# Patient Record
Sex: Female | Born: 1970 | Race: Black or African American | Hispanic: No | Marital: Single | Smoking: Never smoker
Health system: Southern US, Community
[De-identification: ages and names within clinical notes are randomized; demographics above are authoritative.]

## PROBLEM LIST (undated history)

## (undated) ENCOUNTER — Emergency Department (HOSPITAL_BASED_OUTPATIENT_CLINIC_OR_DEPARTMENT_OTHER): Admission: EM | Payer: Medicare HMO | Source: Home / Self Care

## (undated) DIAGNOSIS — Z9289 Personal history of other medical treatment: Secondary | ICD-10-CM

## (undated) DIAGNOSIS — D649 Anemia, unspecified: Secondary | ICD-10-CM

## (undated) DIAGNOSIS — R011 Cardiac murmur, unspecified: Secondary | ICD-10-CM

## (undated) DIAGNOSIS — F329 Major depressive disorder, single episode, unspecified: Secondary | ICD-10-CM

## (undated) DIAGNOSIS — J189 Pneumonia, unspecified organism: Secondary | ICD-10-CM

## (undated) DIAGNOSIS — M1990 Unspecified osteoarthritis, unspecified site: Secondary | ICD-10-CM

## (undated) DIAGNOSIS — F419 Anxiety disorder, unspecified: Secondary | ICD-10-CM

## (undated) DIAGNOSIS — F32A Depression, unspecified: Secondary | ICD-10-CM

## (undated) DIAGNOSIS — M797 Fibromyalgia: Secondary | ICD-10-CM

## (undated) DIAGNOSIS — I10 Essential (primary) hypertension: Secondary | ICD-10-CM

## (undated) DIAGNOSIS — J069 Acute upper respiratory infection, unspecified: Secondary | ICD-10-CM

## (undated) HISTORY — PX: ABDOMINAL HYSTERECTOMY: SHX81

## (undated) HISTORY — PX: LAPAROSCOPIC GASTRIC SLEEVE RESECTION: SHX5895

---

## 2002-09-24 DIAGNOSIS — D649 Anemia, unspecified: Secondary | ICD-10-CM

## 2002-09-24 DIAGNOSIS — J189 Pneumonia, unspecified organism: Secondary | ICD-10-CM

## 2002-09-24 HISTORY — PX: DILATION AND CURETTAGE OF UTERUS: SHX78

## 2002-09-24 HISTORY — DX: Anemia, unspecified: D64.9

## 2002-09-24 HISTORY — DX: Pneumonia, unspecified organism: J18.9

## 2002-09-24 HISTORY — PX: CHOLECYSTECTOMY: SHX55

## 2010-03-03 ENCOUNTER — Emergency Department (HOSPITAL_BASED_OUTPATIENT_CLINIC_OR_DEPARTMENT_OTHER): Admission: EM | Admit: 2010-03-03 | Discharge: 2010-03-03 | Payer: Self-pay | Admitting: Emergency Medicine

## 2010-03-03 ENCOUNTER — Ambulatory Visit: Payer: Self-pay | Admitting: Diagnostic Radiology

## 2010-03-27 ENCOUNTER — Emergency Department (HOSPITAL_BASED_OUTPATIENT_CLINIC_OR_DEPARTMENT_OTHER): Admission: EM | Admit: 2010-03-27 | Discharge: 2010-03-27 | Payer: Self-pay | Admitting: Emergency Medicine

## 2010-10-03 ENCOUNTER — Encounter
Admission: RE | Admit: 2010-10-03 | Discharge: 2010-10-03 | Payer: Self-pay | Source: Home / Self Care | Attending: Orthopedic Surgery | Admitting: Orthopedic Surgery

## 2010-12-11 LAB — COMPREHENSIVE METABOLIC PANEL
Alkaline Phosphatase: 108 U/L (ref 39–117)
BUN: 13 mg/dL (ref 6–23)
CO2: 28 mEq/L (ref 19–32)
Calcium: 8.9 mg/dL (ref 8.4–10.5)
Creatinine, Ser: 0.7 mg/dL (ref 0.4–1.2)
GFR calc Af Amer: >60 mL/min (ref >60)
Glucose, Bld: 113 mg/dL — ABNORMAL HIGH (ref 70–99)

## 2010-12-11 LAB — DIFFERENTIAL
Basophils Absolute: 0.3 10*3/uL — ABNORMAL HIGH (ref 0.0–0.1)
Basophils Relative: 3 % — ABNORMAL HIGH (ref 0–1)
Eosinophils Absolute: 0.5 10*3/uL (ref 0.0–0.7)
Eosinophils Relative: 6 % — ABNORMAL HIGH (ref 0–5)
Lymphocytes Relative: 29 % (ref 12–46)

## 2010-12-11 LAB — URINALYSIS, ROUTINE W REFLEX MICROSCOPIC
Bilirubin Urine: NEGATIVE
Glucose, UA: NEGATIVE mg/dL
Hgb urine dipstick: NEGATIVE
Ketones, ur: NEGATIVE mg/dL
Nitrite: NEGATIVE
Protein, ur: NEGATIVE mg/dL
Specific Gravity, Urine: 1.025 (ref 1.005–1.030)

## 2010-12-11 LAB — LIPASE, BLOOD: Lipase: 62 U/L (ref 23–300)

## 2010-12-11 LAB — URINE CULTURE

## 2010-12-11 LAB — STREP A DNA PROBE

## 2010-12-11 LAB — CBC
Hemoglobin: 10.9 g/dL — ABNORMAL LOW (ref 12.0–15.0)
Platelets: 315 10*3/uL (ref 150–400)
RBC: 3.75 MIL/uL — ABNORMAL LOW (ref 3.87–5.11)

## 2010-12-11 LAB — PREGNANCY, URINE: Preg Test, Ur: NEGATIVE

## 2010-12-12 ENCOUNTER — Emergency Department (INDEPENDENT_AMBULATORY_CARE_PROVIDER_SITE_OTHER): Payer: Medicaid Other

## 2010-12-12 ENCOUNTER — Emergency Department (HOSPITAL_BASED_OUTPATIENT_CLINIC_OR_DEPARTMENT_OTHER)
Admission: EM | Admit: 2010-12-12 | Discharge: 2010-12-13 | Disposition: A | Discharge status: Home / Self Care | Payer: Medicaid Other | Attending: Emergency Medicine | Admitting: Emergency Medicine

## 2010-12-12 DIAGNOSIS — E669 Obesity, unspecified: Secondary | ICD-10-CM | POA: Insufficient documentation

## 2010-12-12 DIAGNOSIS — I10 Essential (primary) hypertension: Secondary | ICD-10-CM

## 2010-12-12 DIAGNOSIS — G8929 Other chronic pain: Secondary | ICD-10-CM | POA: Insufficient documentation

## 2010-12-12 DIAGNOSIS — R109 Unspecified abdominal pain: Secondary | ICD-10-CM | POA: Insufficient documentation

## 2010-12-12 DIAGNOSIS — E7800 Pure hypercholesterolemia, unspecified: Secondary | ICD-10-CM | POA: Insufficient documentation

## 2010-12-12 DIAGNOSIS — N390 Urinary tract infection, site not specified: Secondary | ICD-10-CM | POA: Insufficient documentation

## 2010-12-12 DIAGNOSIS — K5900 Constipation, unspecified: Secondary | ICD-10-CM | POA: Insufficient documentation

## 2010-12-12 LAB — COMPREHENSIVE METABOLIC PANEL
BUN: 10 mg/dL (ref 6–23)
CO2: 24 mEq/L (ref 19–32)
Calcium: 9.1 mg/dL (ref 8.4–10.5)
Creatinine, Ser: 0.7 mg/dL (ref 0.4–1.2)
GFR calc non Af Amer: >60 mL/min (ref >60)
Potassium: 4.2 mEq/L (ref 3.5–5.1)
Sodium: 142 mEq/L (ref 135–145)
Total Bilirubin: 0.4 mg/dL (ref 0.3–1.2)

## 2010-12-12 LAB — URINALYSIS, ROUTINE W REFLEX MICROSCOPIC
Bilirubin Urine: NEGATIVE
Glucose, UA: NEGATIVE mg/dL
Ketones, ur: NEGATIVE mg/dL
Urobilinogen, UA: 0.2 mg/dL (ref 0.0–1.0)

## 2010-12-12 LAB — DIFFERENTIAL
Basophils Relative: 1 % (ref 0–1)
Eosinophils Absolute: 0.5 10*3/uL (ref 0.0–0.7)
Lymphocytes Relative: 37 % (ref 12–46)
Lymphs Abs: 2.8 10*3/uL (ref 0.7–4.0)
Monocytes Absolute: 0.9 10*3/uL (ref 0.1–1.0)
Monocytes Relative: 12 % (ref 3–12)
Neutrophils Relative %: 43 % (ref 43–77)

## 2010-12-12 LAB — WET PREP, GENITAL: Yeast Wet Prep HPF POC: NONE SEEN

## 2010-12-12 LAB — URINE MICROSCOPIC-ADD ON

## 2010-12-12 LAB — CBC
HCT: 33.5 % — ABNORMAL LOW (ref 36.0–46.0)
MCH: 28.1 pg (ref 26.0–34.0)
MCV: 86.3 fL (ref 78.0–100.0)
Platelets: 375 10*3/uL (ref 150–400)
RBC: 3.88 MIL/uL (ref 3.87–5.11)
RDW: 14.5 % (ref 11.5–15.5)
WBC: 7.5 10*3/uL (ref 4.0–10.5)

## 2011-03-28 ENCOUNTER — Emergency Department (INDEPENDENT_AMBULATORY_CARE_PROVIDER_SITE_OTHER): Payer: Medicaid Other

## 2011-03-28 ENCOUNTER — Emergency Department (HOSPITAL_BASED_OUTPATIENT_CLINIC_OR_DEPARTMENT_OTHER)
Admission: EM | Admit: 2011-03-28 | Discharge: 2011-03-28 | Disposition: A | Discharge status: Home / Self Care | Payer: Medicaid Other | Attending: Emergency Medicine | Admitting: Emergency Medicine

## 2011-03-28 DIAGNOSIS — R05 Cough: Secondary | ICD-10-CM

## 2011-03-28 DIAGNOSIS — G8929 Other chronic pain: Secondary | ICD-10-CM | POA: Insufficient documentation

## 2011-03-28 DIAGNOSIS — R1013 Epigastric pain: Secondary | ICD-10-CM

## 2011-03-28 DIAGNOSIS — E669 Obesity, unspecified: Secondary | ICD-10-CM | POA: Insufficient documentation

## 2011-03-28 DIAGNOSIS — E7800 Pure hypercholesterolemia, unspecified: Secondary | ICD-10-CM | POA: Insufficient documentation

## 2011-03-28 LAB — URINALYSIS, ROUTINE W REFLEX MICROSCOPIC
Leukocytes, UA: NEGATIVE
Protein, ur: NEGATIVE mg/dL
Urobilinogen, UA: 0.2 mg/dL (ref 0.0–1.0)

## 2011-03-28 LAB — PREGNANCY, URINE: Preg Test, Ur: NEGATIVE

## 2011-03-28 LAB — CBC
HCT: 31 % — ABNORMAL LOW (ref 36.0–46.0)
Hemoglobin: 10.2 g/dL — ABNORMAL LOW (ref 12.0–15.0)
MCH: 27.3 pg (ref 26.0–34.0)
MCHC: 32.9 g/dL (ref 30.0–36.0)

## 2011-03-28 LAB — COMPREHENSIVE METABOLIC PANEL
BUN: 12 mg/dL (ref 6–23)
Calcium: 9.8 mg/dL (ref 8.4–10.5)
GFR calc Af Amer: >60 mL/min (ref >60)
Glucose, Bld: 111 mg/dL — ABNORMAL HIGH (ref 70–99)
Sodium: 136 mEq/L (ref 135–145)
Total Protein: 7.2 g/dL (ref 6.0–8.3)

## 2011-03-28 LAB — DIFFERENTIAL
Basophils Relative: 0 % (ref 0–1)
Lymphocytes Relative: 25 % (ref 12–46)
Monocytes Absolute: 0.9 10*3/uL (ref 0.1–1.0)
Neutro Abs: 5.3 10*3/uL (ref 1.7–7.7)

## 2011-03-28 LAB — LIPASE, BLOOD: Lipase: 17 U/L (ref 11–59)

## 2012-01-15 ENCOUNTER — Other Ambulatory Visit: Payer: Self-pay | Admitting: Orthopedic Surgery

## 2012-01-15 DIAGNOSIS — M25552 Pain in left hip: Secondary | ICD-10-CM

## 2012-01-19 ENCOUNTER — Inpatient Hospital Stay: Admission: RE | Admit: 2012-01-19 | Payer: Medicaid Other | Source: Ambulatory Visit

## 2012-01-24 ENCOUNTER — Ambulatory Visit
Admission: RE | Admit: 2012-01-24 | Discharge: 2012-01-24 | Disposition: A | Discharge status: Home / Self Care | Payer: Medicaid Other | Source: Ambulatory Visit | Attending: Orthopedic Surgery | Admitting: Orthopedic Surgery

## 2012-01-24 DIAGNOSIS — M25552 Pain in left hip: Secondary | ICD-10-CM

## 2012-02-15 ENCOUNTER — Encounter (HOSPITAL_COMMUNITY): Payer: Self-pay

## 2012-02-15 ENCOUNTER — Ambulatory Visit (HOSPITAL_COMMUNITY)
Admission: RE | Admit: 2012-02-15 | Discharge: 2012-02-15 | Disposition: A | Discharge status: Home / Self Care | Payer: Medicaid Other | Source: Ambulatory Visit | Attending: Gastroenterology | Admitting: Gastroenterology

## 2012-02-15 ENCOUNTER — Encounter (HOSPITAL_COMMUNITY)
Admission: RE | Disposition: A | Discharge status: Home / Self Care | Payer: Self-pay | Source: Ambulatory Visit | Attending: Gastroenterology

## 2012-02-15 DIAGNOSIS — K644 Residual hemorrhoidal skin tags: Secondary | ICD-10-CM | POA: Insufficient documentation

## 2012-02-15 DIAGNOSIS — R109 Unspecified abdominal pain: Secondary | ICD-10-CM | POA: Insufficient documentation

## 2012-02-15 DIAGNOSIS — K5730 Diverticulosis of large intestine without perforation or abscess without bleeding: Secondary | ICD-10-CM | POA: Insufficient documentation

## 2012-02-15 DIAGNOSIS — I10 Essential (primary) hypertension: Secondary | ICD-10-CM | POA: Insufficient documentation

## 2012-02-15 DIAGNOSIS — R195 Other fecal abnormalities: Secondary | ICD-10-CM | POA: Insufficient documentation

## 2012-02-15 DIAGNOSIS — K648 Other hemorrhoids: Secondary | ICD-10-CM | POA: Insufficient documentation

## 2012-02-15 DIAGNOSIS — IMO0001 Reserved for inherently not codable concepts without codable children: Secondary | ICD-10-CM | POA: Insufficient documentation

## 2012-02-15 HISTORY — DX: Fibromyalgia: M79.7

## 2012-02-15 HISTORY — DX: Unspecified osteoarthritis, unspecified site: M19.90

## 2012-02-15 HISTORY — DX: Anemia, unspecified: D64.9

## 2012-02-15 HISTORY — PX: COLONOSCOPY: SHX5424

## 2012-02-15 HISTORY — DX: Essential (primary) hypertension: I10

## 2012-02-15 HISTORY — PX: ESOPHAGOGASTRODUODENOSCOPY: SHX5428

## 2012-02-15 SURGERY — EGD (ESOPHAGOGASTRODUODENOSCOPY)
Anesthesia: Moderate Sedation

## 2012-02-15 MED ORDER — MIDAZOLAM HCL 10 MG/2ML IJ SOLN
INTRAMUSCULAR | Status: DC | PRN
  Administered 2012-02-15: 2 mg via INTRAVENOUS
  Administered 2012-02-15 (×2): 1 mg via INTRAVENOUS
  Administered 2012-02-15: 2 mg via INTRAVENOUS
  Administered 2012-02-15: 1 mg via INTRAVENOUS

## 2012-02-15 MED ORDER — DIPHENHYDRAMINE HCL 50 MG/ML IJ SOLN
INTRAMUSCULAR | Status: AC
  Filled 2012-02-15: qty 1

## 2012-02-15 MED ORDER — MIDAZOLAM HCL 10 MG/2ML IJ SOLN
INTRAMUSCULAR | Status: AC
  Filled 2012-02-15: qty 4

## 2012-02-15 MED ORDER — FENTANYL NICU IV SYRINGE 50 MCG/ML
INJECTION | INTRAMUSCULAR | Status: DC | PRN
  Administered 2012-02-15 (×4): 25 ug via INTRAVENOUS

## 2012-02-15 MED ORDER — BUTAMBEN-TETRACAINE-BENZOCAINE 2-2-14 % EX AERO
INHALATION_SPRAY | CUTANEOUS | Status: DC | PRN
  Administered 2012-02-15: 2 via TOPICAL

## 2012-02-15 MED ORDER — SODIUM CHLORIDE 0.9 % IV SOLN
Freq: Once | INTRAVENOUS | Status: AC
  Administered 2012-02-15: 14:00:00 via INTRAVENOUS

## 2012-02-15 MED ORDER — FENTANYL CITRATE 0.05 MG/ML IJ SOLN
INTRAMUSCULAR | Status: AC
  Filled 2012-02-15: qty 4

## 2012-02-15 NOTE — Op Note (Signed)
[facility] [address] [place], [place]  [address]  OPERATIVE PROCEDURE REPORT  PATIENT:  Megan Robles, Megan Robles  MR#:  [id number] BIRTHDATE:  07/18/1971  GENDER:  female ENDOSCOPIST:  Jeani Hawking, MD ASSISTANT:  Beryle Beams, Technician and Janae Sauce, RN PROCEDURE DATE:  02/15/2012 PROCEDURE:  Colonoscopy with snare polypectomy ASA CLASS:  Class III INDICATIONS:  ABM pain and Heme positive stool MEDICATIONS:  Fentanyl 25 mcg IV, Versed 1 mg IV  DESCRIPTION OF PROCEDURE:   After the risks benefits and alternatives of the procedure were thoroughly explained, informed consent was obtained.  Digital rectal exam was performed and revealed no abnormalities.   The Pentax Colonoscope [id number] endoscope was introduced through the anus and advanced to the terminal ileum which was intubated for a short distance, without limitations.  The quality of the prep was excellent..  The instrument was then slowly withdrawn as the colon was fully examined. <<PROCEDUREIMAGES>>  FINDINGS:  A 3 mm sessile rectosigmoid colon polyp was removed with a cold snare. Scattered diverticula were noted throughout the entire colon, but the highest concentration was in the sigmoid colon. No other abnormalities identified.   Retroflexed views in the rectum revealed internal and external hemorrhoids.    The scope was then withdrawn from the patient and the procedure terminated.  COMPLICATIONS:  None  IMPRESSION:  1) Sessile polyp 2) Internal and external hemorrhoids 3) Diverticula RECOMMENDATIONS:  1) Await biopsy results. 2) Repeat colonoscopy in 5-10 years. 3) Trial of hyoscyamine. 4) RTC in 1 month.  ______________________________ Jeani Hawking, MD  n. Rosalie DoctorJeani Hawking at 02/15/2012 03:42 PM  Diallo, Delice Bison, [phone]

## 2012-02-15 NOTE — Op Note (Signed)
[facility] [address] [place], [place]  [address]  OPERATIVE PROCEDURE REPORT  PATIENT:  Megan, Robles  MR#:  [id number] BIRTHDATE:  11-Jan-1971  GENDER:  female ENDOSCOPIST:  Jeani Hawking, MD ASSISTANT:  Beryle Beams, Technician and Janae Sauce, RN PROCEDURE DATE:  02/15/2012 PROCEDURE:  EGD, diagnostic [phone] ASA CLASS:  Class III INDICATIONS:  ABM pain and heme positive stool MEDICATIONS:  Fentanyl 75 mcg IV, Versed 6 mg IV  DESCRIPTION OF PROCEDURE:   After the risks benefits and alternatives of the procedure were thoroughly explained, informed consent was obtained.  The Pentax Gastroscope [id number] endoscope was introduced through the mouth and advanced to the second portion of the duodenum, without limitations.  The instrument was slowly withdrawn as the mucosa was fully examined. <<PROCEDUREIMAGES>>  FINDINGS: The upper, middle, and distal third of the esophagus were carefully inspected and no abnormalities were noted. The z-line was well seen at the GEJ. The endoscope was pushed into the fundus which was normal including a retroflexed view. The antrum,gastric body, first and second part of the duodenum were unremarkable.    Retroflexed views revealed no abnormalities. The scope was then withdrawn from the patient and the procedure terminated.  COMPLICATIONS:  None  IMPRESSION:  1) Normal EGD RECOMMENDATIONS:  1) Proceed with the colonoscopy.  ______________________________ Jeani Hawking, MD  n. Rosalie DoctorJeani Hawking at 02/15/2012 03:13 PM  Alfonse Alpers, [phone]

## 2012-02-15 NOTE — H&P (Signed)
  Reason for Consult: ABM pain and Heme positive stool Referring Physician: Joneen Roach, N.P.  Megan Robles HPI: This is a 41 year old female with complains of worsening abdominal pain over the past 7 months, however, the onset of the pain was one year ago.  The pain is described as a sharp pain in the upper abdomen.  She will also have twinges of pain in her right side of the abdomen.  The sharp pain will last for approximately 2 minutes and then it can recur in 4-5 hours.  No association with diarrhea or constipation, but she reports one discrete episode of minor hematochezia.  She does not notice any inciting factors for her pain.  Physical movement does not seem to worsen the pain.  Indomethacin was prescribed for her one year ago for arthritis in her hips and legs.  Past Medical History  Diagnosis Date  . Hypertension   . Anemia   . Arthritis   . Fibromyalgia     Past Surgical History  Procedure Date  . Cholecystectomy 2004  . Dilation and curettage of uterus 2004    History reviewed. No pertinent family history.  Social History:  reports that she has never smoked. She does not have any smokeless tobacco history on file. She reports that she does not drink alcohol or use illicit drugs.  Allergies: Not on File  Medications:  Scheduled:   . sodium chloride   Intravenous Once   Continuous:   No results found for this or any previous visit (from the past 24 hour(s)).   No results found.  ROS:  As stated above in the HPI otherwise negative.  Blood pressure 150/89, pulse 84, temperature 98.1 F (36.7 C), temperature source Oral, resp. rate 18, height 5\' 4"  (1.626 m), weight 146.058 kg (322 lb), last menstrual period 12/16/2011, SpO2 97.00%.    PE: Gen: NAD, Alert and Oriented HEENT:  [place]/AT, EOMI Neck: Supple, no LAD Lungs: CTA Bilaterally CV: RRR without M/G/R ABM: Soft, NTND, +BS Ext: No C/C/E  Assessment/Plan: 1) ABM pain. 2) Heme positive  stool.  Plan: 1) EGD/Colonoscopy.  Bob Eastwood D 02/15/2012, 2:34 PM

## 2012-02-15 NOTE — Discharge Instructions (Addendum)
Endoscopy Care After Please read the instructions outlined below and refer to this sheet in the next few weeks. These discharge instructions provide you with general information on caring for yourself after you leave the hospital. Your doctor may also give you specific instructions. While your treatment has been planned according to the most current medical practices available, unavoidable complications occasionally occur. If you have any problems or questions after discharge, please call your doctor. HOME CARE INSTRUCTIONS Activity  You may resume your regular activity but move at a slower pace for the next 24 hours.   Take frequent rest periods for the next 24 hours.   Walking will help expel (get rid of) the air and reduce the bloated feeling in your abdomen.   No driving for 24 hours (because of the anesthesia (medicine) used during the test).   You may shower.   Do not sign any important legal documents or operate any machinery for 24 hours (because of the anesthesia used during the test).  Nutrition  Drink plenty of fluids.   You may resume your normal diet.   Begin with a light meal and progress to your normal diet.   Avoid alcoholic beverages for 24 hours or as instructed by your caregiver.  Medications You may resume your normal medications unless your caregiver tells you otherwise. What you can expect today  You may experience abdominal discomfort such as a feeling of fullness or "gas" pains.   You may experience a sore throat for 2 to 3 days. This is normal. Gargling with salt water may help this.  Follow-up Your doctor will discuss the results of your test with you. SEEK IMMEDIATE MEDICAL CARE IF:  You have excessive nausea (feeling sick to your stomach) and/or vomiting.   You have severe abdominal pain and distention (swelling).   You have trouble swallowing.   You have a temperature over 100 F (37.8 C).   You have rectal bleeding or vomiting of blood.    Document Released: 04/24/2004 Document Revised: 08/30/2011 Document Reviewed: 11/05/2007 ExitCare Patient Information 2012 [employer].  Colonoscopy Care After Read the instructions outlined below and refer to this sheet in the next few weeks. These discharge instructions provide you with general information on caring for yourself after you leave the hospital. Your doctor may also give you specific instructions. While your treatment has been planned according to the most current medical practices available, unavoidable complications occasionally occur. If you have any problems or questions after discharge, call your doctor. HOME CARE INSTRUCTIONS ACTIVITY:  You may resume your regular activity, but move at a slower pace for the next 24 hours.   Take frequent rest periods for the next 24 hours.   Walking will help get rid of the air and reduce the bloated feeling in your belly (abdomen).   No driving for 24 hours (because of the medicine (anesthesia) used during the test).   You may shower.   Do not sign any important legal documents or operate any machinery for 24 hours (because of the anesthesia used during the test).  NUTRITION:  Drink plenty of fluids.   You may resume your normal diet as instructed by your doctor.   Begin with a light meal and progress to your normal diet. Heavy or fried foods are harder to digest and may make you feel sick to your stomach (nauseated).   Avoid alcoholic beverages for 24 hours or as instructed.  MEDICATIONS:  You may resume your normal medications unless your   doctor tells you otherwise.  WHAT TO EXPECT TODAY:  Some feelings of bloating in the abdomen.   Passage of more gas than usual.   Spotting of blood in your stool or on the toilet paper.  IF YOU HAD POLYPS REMOVED DURING THE COLONOSCOPY:  No aspirin products for 7 days or as instructed.   No alcohol for 7 days or as instructed.   Eat a soft diet for the next 24 hours.   FINDING OUT THE RESULTS OF YOUR TEST Not all test results are available during your visit. If your test results are not back during the visit, make an appointment with your caregiver to find out the results. Do not assume everything is normal if you have not heard from your caregiver or the medical facility. It is important for you to follow up on all of your test results.  SEEK IMMEDIATE MEDICAL CARE IF:  You have more than a spotting of blood in your stool.   Your belly is swollen (abdominal distention).   You are nauseated or vomiting.   You have a fever.   You have abdominal pain or discomfort that is severe or gets worse throughout the day.  Document Released: 04/24/2004 Document Revised: 08/30/2011 Document Reviewed: 04/22/2008 ExitCare Patient Information 2012 [employer]. 

## 2012-02-20 ENCOUNTER — Encounter (HOSPITAL_COMMUNITY): Payer: Self-pay | Admitting: Gastroenterology

## 2014-01-14 ENCOUNTER — Encounter (HOSPITAL_COMMUNITY): Payer: Self-pay | Admitting: Pharmacy Technician

## 2014-01-15 ENCOUNTER — Encounter (INDEPENDENT_AMBULATORY_CARE_PROVIDER_SITE_OTHER): Payer: Self-pay

## 2014-01-15 ENCOUNTER — Encounter (HOSPITAL_COMMUNITY): Payer: Self-pay

## 2014-01-15 ENCOUNTER — Ambulatory Visit (HOSPITAL_COMMUNITY)
Admission: RE | Admit: 2014-01-15 | Discharge: 2014-01-15 | Disposition: A | Discharge status: Home / Self Care | Payer: Medicaid Other | Source: Ambulatory Visit | Attending: Anesthesiology | Admitting: Anesthesiology

## 2014-01-15 ENCOUNTER — Encounter (HOSPITAL_COMMUNITY)
Admission: RE | Admit: 2014-01-15 | Discharge: 2014-01-15 | Disposition: A | Discharge status: Home / Self Care | Payer: Medicaid Other | Source: Ambulatory Visit | Attending: Orthopedic Surgery | Admitting: Orthopedic Surgery

## 2014-01-15 DIAGNOSIS — Z01818 Encounter for other preprocedural examination: Secondary | ICD-10-CM | POA: Insufficient documentation

## 2014-01-15 DIAGNOSIS — Z01812 Encounter for preprocedural laboratory examination: Secondary | ICD-10-CM | POA: Insufficient documentation

## 2014-01-15 DIAGNOSIS — M169 Osteoarthritis of hip, unspecified: Secondary | ICD-10-CM | POA: Insufficient documentation

## 2014-01-15 DIAGNOSIS — M1610 Unilateral primary osteoarthritis, unspecified hip: Secondary | ICD-10-CM | POA: Insufficient documentation

## 2014-01-15 HISTORY — DX: Depression, unspecified: F32.A

## 2014-01-15 HISTORY — DX: Personal history of other medical treatment: Z92.89

## 2014-01-15 HISTORY — DX: Pneumonia, unspecified organism: J18.9

## 2014-01-15 HISTORY — DX: Major depressive disorder, single episode, unspecified: F32.9

## 2014-01-15 LAB — URINALYSIS, ROUTINE W REFLEX MICROSCOPIC
BILIRUBIN URINE: NEGATIVE
Glucose, UA: NEGATIVE mg/dL
HGB URINE DIPSTICK: NEGATIVE
Ketones, ur: NEGATIVE mg/dL
Nitrite: NEGATIVE
PROTEIN: 30 mg/dL — AB
Specific Gravity, Urine: 1.025 (ref 1.005–1.030)
UROBILINOGEN UA: 0.2 mg/dL (ref 0.0–1.0)
pH: 6 (ref 5.0–8.0)

## 2014-01-15 LAB — BASIC METABOLIC PANEL WITH GFR
BUN: 14 mg/dL (ref 6–23)
CO2: 23 meq/L (ref 19–32)
Calcium: 9.1 mg/dL (ref 8.4–10.5)
Chloride: 99 meq/L (ref 96–112)
Creatinine, Ser: 0.74 mg/dL (ref 0.50–1.10)
GFR calc Af Amer: >90 mL/min
GFR calc non Af Amer: >90 mL/min
Glucose, Bld: 106 mg/dL — ABNORMAL HIGH (ref 70–99)
Potassium: 3.8 meq/L (ref 3.7–5.3)
Sodium: 135 meq/L — ABNORMAL LOW (ref 137–147)

## 2014-01-15 LAB — HCG, SERUM, QUALITATIVE: Preg, Serum: NEGATIVE

## 2014-01-15 LAB — SURGICAL PCR SCREEN
MRSA, PCR: NEGATIVE
Staphylococcus aureus: NEGATIVE

## 2014-01-15 LAB — CBC
HCT: 31.4 % — ABNORMAL LOW (ref 36.0–46.0)
Hemoglobin: 9.7 g/dL — ABNORMAL LOW (ref 12.0–15.0)
MCH: 24 pg — ABNORMAL LOW (ref 26.0–34.0)
MCHC: 30.9 g/dL (ref 30.0–36.0)
MCV: 77.7 fL — ABNORMAL LOW (ref 78.0–100.0)
Platelets: 393 10*3/uL (ref 150–400)
RBC: 4.04 MIL/uL (ref 3.87–5.11)
RDW: 15.6 % — ABNORMAL HIGH (ref 11.5–15.5)
WBC: 6.5 10*3/uL (ref 4.0–10.5)

## 2014-01-15 LAB — APTT: aPTT: 34 seconds (ref 24–37)

## 2014-01-15 LAB — PROTIME-INR
INR: 1.01 (ref 0.00–1.49)
Prothrombin Time: 13.1 seconds (ref 11.6–15.2)

## 2014-01-15 LAB — URINE MICROSCOPIC-ADD ON

## 2014-01-15 NOTE — Patient Instructions (Signed)
Your procedure is scheduled on:  01/26/14  TUESDAY  [facility] at   0600    AM.  Call this number if you have problems the morning of surgery: [phone]        Do not eat food  Or drink :After Midnight Monday NIGHT.   Take these medicines the morning of surgery with A SIP OF WATER:NO REGULAR MEDICATIONS--- MAY TAKE TRAMADOL IF NEEDED   .  Contacts, dentures or partial plates, or metal hairpins  can not be worn to surgery. Your family will be responsible for glasses, dentures, hearing aides while you are in surgery  Leave suitcase in the car. After surgery it may be brought to your room.  For patients admitted to the hospital, checkout time is 11:00 AM day of  discharge.                DO NOT WEAR JEWELRY, LOTIONS, POWDERS, OR PERFUMES.  WOMEN-- DO NOT SHAVE LEGS OR UNDERARMS FOR 48 HOURS BEFORE SHOWERS. MEN MAY SHAVE FACE.  Patients discharged the day of surgery will not be allowed to drive home. IF going home the day of surgery, you must have a driver and someone to stay with you for the first 24 hours                                                                 [facility] - Preparing for Surgery Before surgery, you can play an important role.  Because skin is not sterile, your skin needs to be as free of germs as possible.  You can reduce the number of germs on your skin by washing with CHG (chlorahexidine gluconate) soap before surgery.  CHG is an antiseptic cleaner which kills germs and bonds with the skin to continue killing germs even after washing. Please DO NOT use if you have an allergy to CHG or antibacterial soaps.  If your skin becomes reddened/irritated stop using the CHG and inform your nurse when you arrive at Short Stay. Do not shave (including legs and underarms) for at least 48 hours prior to the first CHG shower.  You may shave your face. Please follow these instructions carefully:  1.  Shower with CHG Soap the night before surgery and  the  morning of Surgery.  2.  If you choose to wash your hair, wash your hair first as usual with your  normal  shampoo.  3.  After you shampoo, rinse your hair and body thoroughly to remove the  shampoo.                           4.  Use CHG as you would any other liquid soap.  You can apply chg directly  to the skin and wash                       Gently with a scrungie or clean washcloth.  5.  Apply the CHG Soap to your body ONLY FROM THE NECK DOWN.   Do not use on open  Wound or open sores. Avoid contact with eyes, ears mouth and genitals (private parts).                        Genitals (private parts) with your normal soap.             6.  Wash thoroughly, paying special attention to the area where your surgery  will be performed.  7.  Thoroughly rinse your body with warm water from the neck down.  8.  DO NOT shower/wash with your normal soap after using and rinsing off  the CHG Soap.                9.  Pat yourself dry with a clean towel.            10.  Wear clean pajamas.            11.  Place clean sheets on your bed the night of your first shower and do not  sleep with pets. Day of Surgery : Do not apply any lotions/deodorants the morning of surgery.  Please wear clean clothes to the hospital/surgery center.  FAILURE TO FOLLOW THESE INSTRUCTIONS MAY RESULT IN THE CANCELLATION OF YOUR SURGERY PATIENT SIGNATURE_________________________________  NURSE SIGNATURE__________________________________   Incentive Spirometer  An incentive spirometer is a tool that can help keep your lungs clear and active. This tool measures how well you are filling your lungs with each breath. Taking long deep breaths may help reverse or decrease the chance of developing breathing (pulmonary) problems (especially infection) following:  A long period of time when you are unable to move or be active. BEFORE THE PROCEDURE   If the spirometer includes an indicator to show your best  effort, your nurse or respiratory therapist will set it to a desired goal.  If possible, sit up straight or lean slightly forward. Try not to slouch.  Hold the incentive spirometer in an upright position. INSTRUCTIONS FOR USE  1. Sit on the edge of your bed if possible, or sit up as far as you can in bed or on a chair. 2. Hold the incentive spirometer in an upright position. 3. Breathe out normally. 4. Place the mouthpiece in your mouth and seal your lips tightly around it. 5. Breathe in slowly and as deeply as possible, raising the piston or the ball toward the top of the column. 6. Hold your breath for 3-5 seconds or for as long as possible. Allow the piston or ball to fall to the bottom of the column. 7. Remove the mouthpiece from your mouth and breathe out normally. 8. Rest for a few seconds and repeat Steps 1 through 7 at least 10 times every 1-2 hours when you are awake. Take your time and take a few normal breaths between deep breaths. 9. The spirometer may include an indicator to show your best effort. Use the indicator as a goal to work toward during each repetition. 10. After each set of 10 deep breaths, practice coughing to be sure your lungs are clear. If you have an incision (the cut made at the time of surgery), support your incision when coughing by placing a pillow or rolled up towels firmly against it. Once you are able to get out of bed, walk around indoors and cough well. You may stop using the incentive spirometer when instructed by your caregiver.  RISKS AND COMPLICATIONS  Take your time so you do not get dizzy or light-headed.  If you are in pain, you may need to take or ask for pain medication before doing incentive spirometry. It is harder to take a deep breath if you are having pain. AFTER USE  Rest and breathe slowly and easily.  It can be helpful to keep track of a log of your progress. Your caregiver can provide you with a simple table to help with this. If you  are using the spirometer at home, follow these instructions: SEEK MEDICAL CARE IF:   You are having difficultly using the spirometer.  You have trouble using the spirometer as often as instructed.  Your pain medication is not giving enough relief while using the spirometer.  You develop fever of 100.5 F (38.1 C) or higher. SEEK IMMEDIATE MEDICAL CARE IF:   You cough up bloody sputum that had not been present before.  You develop fever of 102 F (38.9 C) or greater.  You develop worsening pain at or near the incision site. MAKE SURE YOU:   Understand these instructions.  Will watch your condition.  Will get help right away if you are not doing well or get worse. Document Released: 01/21/2007 Document Revised: 12/03/2011 Document Reviewed: 03/24/2007 [facility] Patient Information 2014 [place], [place].   WHAT IS A BLOOD TRANSFUSION? Blood Transfusion Information  A transfusion is the replacement of blood or some of its parts. Blood is made up of multiple cells which provide different functions.  Red blood cells carry oxygen and are used for blood loss replacement.  White blood cells fight against infection.  Platelets control bleeding.  Plasma helps clot blood.  Other blood products are available for specialized needs, such as hemophilia or other clotting disorders. BEFORE THE TRANSFUSION  Who gives blood for transfusions?   Healthy volunteers who are fully evaluated to make sure their blood is safe. This is blood bank blood. Transfusion therapy is the safest it has ever been in the practice of medicine. Before blood is taken from a donor, a complete history is taken to make sure that person has no history of diseases nor engages in risky social behavior (examples are intravenous drug use or sexual activity with multiple partners). The donor's travel history is screened to minimize risk of transmitting infections, such as malaria. The donated blood is tested for signs of  infectious diseases, such as HIV and hepatitis. The blood is then tested to be sure it is compatible with you in order to minimize the chance of a transfusion reaction. If you or a relative donates blood, this is often done in anticipation of surgery and is not appropriate for emergency situations. It takes many days to process the donated blood. RISKS AND COMPLICATIONS Although transfusion therapy is very safe and saves many lives, the main dangers of transfusion include:   Getting an infectious disease.  Developing a transfusion reaction. This is an allergic reaction to something in the blood you were given. Every precaution is taken to prevent this. The decision to have a blood transfusion has been considered carefully by your caregiver before blood is given. Blood is not given unless the benefits outweigh the risks. AFTER THE TRANSFUSION  Right after receiving a blood transfusion, you will usually feel much better and more energetic. This is especially true if your red blood cells have gotten low (anemic). The transfusion raises the level of the red blood cells which carry oxygen, and this usually causes an energy increase.  The nurse administering the transfusion will monitor you carefully for complications. HOME  CARE INSTRUCTIONS  No special instructions are needed after a transfusion. You may find your energy is better. Speak with your caregiver about any limitations on activity for underlying diseases you may have. SEEK MEDICAL CARE IF:   Your condition is not improving after your transfusion.  You develop redness or irritation at the intravenous (IV) site. SEEK IMMEDIATE MEDICAL CARE IF:  Any of the following symptoms occur over the next 12 hours:  Shaking chills.  You have a temperature by mouth above 102 F (38.9 C), not controlled by medicine.  Chest, back, or muscle pain.  People around you feel you are not acting correctly or are confused.  Shortness of breath or  difficulty breathing.  Dizziness and fainting.  You get a rash or develop hives.  You have a decrease in urine output.  Your urine turns a dark color or changes to pink, red, or brown. Any of the following symptoms occur over the next 10 days:  You have a temperature by mouth above 102 F (38.9 C), not controlled by medicine.  Shortness of breath.  Weakness after normal activity.  The white part of the eye turns yellow (jaundice).  You have a decrease in the amount of urine or are urinating less often.  Your urine turns a dark color or changes to pink, red, or brown. Document Released: 09/07/2000 Document Revised: 12/03/2011 Document Reviewed: 04/26/2008 [facility] Patient Information 2014 [place]

## 2014-01-15 NOTE — Progress Notes (Signed)
ekg  5/14 chart

## 2014-01-15 NOTE — Progress Notes (Signed)
Faxed cbc, bmet, u/a with micro [facility]

## 2014-01-15 NOTE — Progress Notes (Signed)
01/15/14 0913  OBSTRUCTIVE SLEEP APNEA  Have you ever been diagnosed with sleep apnea through a sleep study? No  Do you snore loudly (loud enough to be heard through closed doors)?  1  Do you often feel tired, fatigued, or sleepy during the daytime? 1  Has anyone observed you stop breathing during your sleep? 0  Do you have, or are you being treated for high blood pressure? 1  BMI more than 35 kg/m2? 1  Age over 41 years old? 0  Neck circumference greater than 40 cm/16 inches? 0  Gender: 0  Obstructive Sleep Apnea Score 4  Score 4 or greater  Results sent to PCP

## 2014-01-19 NOTE — Progress Notes (Signed)
[facility] [place] were received and cipro was  called in 01/15/14

## 2014-01-21 ENCOUNTER — Other Ambulatory Visit: Payer: Self-pay | Admitting: Physician Assistant

## 2014-01-21 NOTE — H&P (Signed)
Megan Robles is an 43 y.o. female.    Chief Complaint: left hip pain  HPI: Pt is a 43 y.o. female complaining of left hip pain for multiple years. Pain had continually increased since the beginning. X-rays in the clinic show end-stage arthritic changes of the left hip. Pt has tried various conservative treatments which have failed to alleviate their symptoms, including injections and therapy. Various options are discussed with the patient. Risks, benefits and expectations were discussed with the patient. Patient understand the risks, benefits and expectations and wishes to proceed with surgery.   PCP:  [name], GILLIAN W, NP  D/C Plans:  SNF/Rehab  PMH: Past Medical History  Diagnosis Date  . Hypertension   . Anemia   . Arthritis   . Fibromyalgia   . Depression   . History of blood transfusion   . Pneumonia     PSH: Past Surgical History  Procedure Laterality Date  . Cholecystectomy  2004  . Dilation and curettage of uterus  2004  . Esophagogastroduodenoscopy  02/15/2012    Procedure: ESOPHAGOGASTRODUODENOSCOPY (EGD);  Surgeon: Theda BelfastPatrick D Hung, MD;  [facility];  Service: Endoscopy;  Laterality: N/A;  . Colonoscopy  02/15/2012    Procedure: COLONOSCOPY;  Surgeon: Theda BelfastPatrick D Hung, MD;  [facility];  Service: Endoscopy;  Laterality: N/A;    Social History:  reports that she has never smoked. She has never used smokeless tobacco. She reports that she does not drink alcohol or use illicit drugs.  Allergies:  Allergies  Allergen Reactions  . Banana     Mouth itchy and swelling  . Kiwi Extract     Mouth itchy and swelling  . Pineapple     Mouth itchy    Medications: Current Outpatient Prescriptions  Medication Sig Dispense Refill  . diclofenac sodium (VOLTAREN) 1 % GEL Apply topically 4 (four) times daily.      . DULoxetine (CYMBALTA) 60 MG capsule Take 60 mg by mouth at bedtime.       . ferrous sulfate 325 (65 FE) MG EC tablet Take 325 mg by mouth as  needed.      . hydrochlorothiazide (MICROZIDE) 12.5 MG capsule Take 25 mg by mouth at bedtime.      . indomethacin (INDOCIN) 25 MG capsule Take 50 mg by mouth 4 (four) times daily.       . traMADol (ULTRAM) 50 MG tablet Take 100 mg by mouth 3 (three) times daily.       No current facility-administered medications for this visit.    No results found for this or any previous visit (from the past 48 hour(s)). No results found.  ROS: Pain with rom of the left lower extremity  Physical Exam: BP:   142/84  ;  HR:   80  ; Resp:   14  : Alert and oriented 43 y.o. female in no acute distress Cranial nerves 2-12 intact Cervical spine: full rom with no tenderness, nv intact distally Chest: active breath sounds bilaterally, no wheeze rhonchi or rales Heart: regular rate and rhythm, no murmur Abd: non tender non distended with active bowel sounds Hip is stable with rom  Moderate stiffness and pain with ER and IR of left hip nv intact distally No rashes or edema Minimally antalgic gait  Assessment/Plan Assessment: left Hip OA and pain   Plan: Patient will undergo a left total hip arthroplasty by Dr. Charlann Boxerlin. Risks benefits and expectations were discussed with the patient. Patient understand risks, benefits and  expectations and wishes to proceed.

## 2014-01-25 MED ORDER — DEXTROSE 5 % IV SOLN
3.0000 g | INTRAVENOUS | Status: AC
  Administered 2014-01-26: 3 g via INTRAVENOUS
  Filled 2014-01-25: qty 3000

## 2014-01-26 ENCOUNTER — Inpatient Hospital Stay (HOSPITAL_COMMUNITY): Payer: Medicaid Other

## 2014-01-26 ENCOUNTER — Encounter (HOSPITAL_COMMUNITY): Payer: Medicaid Other | Admitting: Registered Nurse

## 2014-01-26 ENCOUNTER — Encounter (HOSPITAL_COMMUNITY)
Admission: RE | Disposition: A | Discharge status: Home Health | Payer: Self-pay | Source: Ambulatory Visit | Attending: Orthopedic Surgery

## 2014-01-26 ENCOUNTER — Encounter (HOSPITAL_COMMUNITY): Payer: Self-pay | Admitting: *Deleted

## 2014-01-26 ENCOUNTER — Inpatient Hospital Stay (HOSPITAL_COMMUNITY): Payer: Medicaid Other | Admitting: Registered Nurse

## 2014-01-26 ENCOUNTER — Inpatient Hospital Stay (HOSPITAL_COMMUNITY)
Admission: RE | Admit: 2014-01-26 | Discharge: 2014-01-29 | DRG: 470 | Disposition: A | Discharge status: Home Health | Payer: Medicaid Other | Source: Ambulatory Visit | Attending: Orthopedic Surgery | Admitting: Orthopedic Surgery

## 2014-01-26 DIAGNOSIS — R42 Dizziness and giddiness: Secondary | ICD-10-CM | POA: Diagnosis not present

## 2014-01-26 DIAGNOSIS — I10 Essential (primary) hypertension: Secondary | ICD-10-CM | POA: Diagnosis present

## 2014-01-26 DIAGNOSIS — Z6841 Body Mass Index (BMI) 40.0 and over, adult: Secondary | ICD-10-CM

## 2014-01-26 DIAGNOSIS — F329 Major depressive disorder, single episode, unspecified: Secondary | ICD-10-CM | POA: Diagnosis present

## 2014-01-26 DIAGNOSIS — Z96649 Presence of unspecified artificial hip joint: Secondary | ICD-10-CM

## 2014-01-26 DIAGNOSIS — M169 Osteoarthritis of hip, unspecified: Principal | ICD-10-CM | POA: Diagnosis present

## 2014-01-26 DIAGNOSIS — IMO0001 Reserved for inherently not codable concepts without codable children: Secondary | ICD-10-CM | POA: Diagnosis present

## 2014-01-26 DIAGNOSIS — M1610 Unilateral primary osteoarthritis, unspecified hip: Principal | ICD-10-CM | POA: Diagnosis present

## 2014-01-26 DIAGNOSIS — D500 Iron deficiency anemia secondary to blood loss (chronic): Secondary | ICD-10-CM | POA: Diagnosis not present

## 2014-01-26 DIAGNOSIS — F3289 Other specified depressive episodes: Secondary | ICD-10-CM | POA: Diagnosis present

## 2014-01-26 DIAGNOSIS — D62 Acute posthemorrhagic anemia: Secondary | ICD-10-CM | POA: Diagnosis not present

## 2014-01-26 DIAGNOSIS — Z79899 Other long term (current) drug therapy: Secondary | ICD-10-CM

## 2014-01-26 HISTORY — PX: TOTAL HIP ARTHROPLASTY: SHX124

## 2014-01-26 LAB — PREPARE RBC (CROSSMATCH)

## 2014-01-26 LAB — ABO/RH: ABO/RH(D): O POS

## 2014-01-26 SURGERY — ARTHROPLASTY, HIP, TOTAL, ANTERIOR APPROACH
Anesthesia: General | Site: Hip | Laterality: Left

## 2014-01-26 MED ORDER — SENNA 8.6 MG PO TABS
1.0000 | ORAL_TABLET | Freq: Two times a day (BID) | ORAL | Status: DC
  Administered 2014-01-26 – 2014-01-29 (×6): 8.6 mg via ORAL

## 2014-01-26 MED ORDER — DEXAMETHASONE SODIUM PHOSPHATE 10 MG/ML IJ SOLN
INTRAMUSCULAR | Status: AC
  Filled 2014-01-26: qty 1

## 2014-01-26 MED ORDER — MIDAZOLAM HCL 2 MG/2ML IJ SOLN
INTRAMUSCULAR | Status: AC
  Filled 2014-01-26: qty 2

## 2014-01-26 MED ORDER — LABETALOL HCL 5 MG/ML IV SOLN
INTRAVENOUS | Status: DC | PRN
  Administered 2014-01-26: 5 mg via INTRAVENOUS
  Administered 2014-01-26 (×2): 2.5 mg via INTRAVENOUS
  Administered 2014-01-26: 5 mg via INTRAVENOUS
  Administered 2014-01-26: 2.5 mg via INTRAVENOUS

## 2014-01-26 MED ORDER — HYDROMORPHONE HCL PF 1 MG/ML IJ SOLN
INTRAMUSCULAR | Status: AC
Start: 2014-01-26 — End: 2014-01-26
  Filled 2014-01-26: qty 1

## 2014-01-26 MED ORDER — GLYCOPYRROLATE 0.2 MG/ML IJ SOLN
INTRAMUSCULAR | Status: DC | PRN
  Administered 2014-01-26: .8 mg via INTRAVENOUS

## 2014-01-26 MED ORDER — 0.9 % SODIUM CHLORIDE (POUR BTL) OPTIME
TOPICAL | Status: DC | PRN
  Administered 2014-01-26: 1000 mL

## 2014-01-26 MED ORDER — HYDROCHLOROTHIAZIDE 12.5 MG PO CAPS
25.0000 mg | ORAL_CAPSULE | Freq: Every day | ORAL | Status: DC
  Administered 2014-01-26 – 2014-01-29 (×3): 25 mg via ORAL
  Filled 2014-01-26 (×4): qty 2

## 2014-01-26 MED ORDER — METHOCARBAMOL 500 MG PO TABS
500.0000 mg | ORAL_TABLET | Freq: Four times a day (QID) | ORAL | Status: DC | PRN
Start: 2014-01-26 — End: 2014-01-29
  Administered 2014-01-26 – 2014-01-29 (×5): 500 mg via ORAL
  Filled 2014-01-26 (×5): qty 1

## 2014-01-26 MED ORDER — LACTATED RINGERS IV SOLN
INTRAVENOUS | Status: DC | PRN
  Administered 2014-01-26: 08:00:00 via INTRAVENOUS

## 2014-01-26 MED ORDER — NEOSTIGMINE METHYLSULFATE 10 MG/10ML IV SOLN
INTRAVENOUS | Status: DC | PRN
  Administered 2014-01-26: 5 mg via INTRAVENOUS

## 2014-01-26 MED ORDER — POLYETHYLENE GLYCOL 3350 17 G PO PACK: 17.0000 g | PACK | Freq: Every day | ORAL | Status: DC | PRN

## 2014-01-26 MED ORDER — STERILE WATER FOR IRRIGATION IR SOLN
Status: DC | PRN
  Administered 2014-01-26: 1500 mL

## 2014-01-26 MED ORDER — GLYCOPYRROLATE 0.2 MG/ML IJ SOLN
INTRAMUSCULAR | Status: AC
  Filled 2014-01-26: qty 4

## 2014-01-26 MED ORDER — ONDANSETRON HCL 4 MG/2ML IJ SOLN: 4.0000 mg | Freq: Four times a day (QID) | INTRAMUSCULAR | Status: DC | PRN

## 2014-01-26 MED ORDER — MENTHOL 3 MG MT LOZG
1.0000 | LOZENGE | OROMUCOSAL | Status: DC | PRN
  Administered 2014-01-26: 3 mg via ORAL
  Filled 2014-01-26: qty 9

## 2014-01-26 MED ORDER — CEFAZOLIN SODIUM-DEXTROSE 2-3 GM-% IV SOLR
2.0000 g | Freq: Four times a day (QID) | INTRAVENOUS | Status: AC
  Administered 2014-01-26 – 2014-01-27 (×2): 2 g via INTRAVENOUS
  Filled 2014-01-26 (×2): qty 50

## 2014-01-26 MED ORDER — DOCUSATE SODIUM 100 MG PO CAPS
100.0000 mg | ORAL_CAPSULE | Freq: Two times a day (BID) | ORAL | Status: DC
  Administered 2014-01-26 – 2014-01-29 (×6): 100 mg via ORAL

## 2014-01-26 MED ORDER — FERROUS SULFATE 325 (65 FE) MG PO TABS
325.0000 mg | ORAL_TABLET | Freq: Three times a day (TID) | ORAL | Status: DC
  Administered 2014-01-26 – 2014-01-29 (×9): 325 mg via ORAL
  Filled 2014-01-26 (×11): qty 1

## 2014-01-26 MED ORDER — HYDROMORPHONE HCL PF 2 MG/ML IJ SOLN
INTRAMUSCULAR | Status: AC
Start: 2014-01-26 — End: 2014-01-26
  Filled 2014-01-26: qty 1

## 2014-01-26 MED ORDER — ONDANSETRON HCL 4 MG/2ML IJ SOLN
INTRAMUSCULAR | Status: AC
  Filled 2014-01-26: qty 2

## 2014-01-26 MED ORDER — PROPOFOL 10 MG/ML IV BOLUS
INTRAVENOUS | Status: DC | PRN
  Administered 2014-01-26: 250 mg via INTRAVENOUS

## 2014-01-26 MED ORDER — SUCCINYLCHOLINE CHLORIDE 20 MG/ML IJ SOLN
INTRAMUSCULAR | Status: DC | PRN
  Administered 2014-01-26: 120 mg via INTRAVENOUS

## 2014-01-26 MED ORDER — LABETALOL HCL 5 MG/ML IV SOLN
INTRAVENOUS | Status: AC
  Filled 2014-01-26: qty 4

## 2014-01-26 MED ORDER — MIDAZOLAM HCL 5 MG/5ML IJ SOLN
INTRAMUSCULAR | Status: DC | PRN
  Administered 2014-01-26: 2 mg via INTRAVENOUS

## 2014-01-26 MED ORDER — LIDOCAINE HCL (CARDIAC) 20 MG/ML IV SOLN
INTRAVENOUS | Status: DC | PRN
  Administered 2014-01-26: 100 mg via INTRAVENOUS

## 2014-01-26 MED ORDER — DULOXETINE HCL 60 MG PO CPEP
60.0000 mg | ORAL_CAPSULE | Freq: Every day | ORAL | Status: DC
  Administered 2014-01-26 – 2014-01-29 (×3): 60 mg via ORAL
  Filled 2014-01-26 (×4): qty 1

## 2014-01-26 MED ORDER — HYDROMORPHONE HCL PF 1 MG/ML IJ SOLN
0.2500 mg | INTRAMUSCULAR | Status: DC | PRN
  Administered 2014-01-26 (×4): 0.5 mg via INTRAVENOUS

## 2014-01-26 MED ORDER — LIDOCAINE HCL (CARDIAC) 20 MG/ML IV SOLN
INTRAVENOUS | Status: AC
  Filled 2014-01-26: qty 5

## 2014-01-26 MED ORDER — PHENOL 1.4 % MT LIQD: 1.0000 | OROMUCOSAL | Status: DC | PRN

## 2014-01-26 MED ORDER — ONDANSETRON HCL 4 MG/2ML IJ SOLN
INTRAMUSCULAR | Status: DC | PRN
  Administered 2014-01-26: 4 mg via INTRAVENOUS

## 2014-01-26 MED ORDER — FENTANYL CITRATE 0.05 MG/ML IJ SOLN
INTRAMUSCULAR | Status: DC | PRN
  Administered 2014-01-26 (×2): 50 ug via INTRAVENOUS
  Administered 2014-01-26: 100 ug via INTRAVENOUS
  Administered 2014-01-26: 50 ug via INTRAVENOUS

## 2014-01-26 MED ORDER — HYDROMORPHONE HCL PF 1 MG/ML IJ SOLN
INTRAMUSCULAR | Status: DC | PRN
  Administered 2014-01-26 (×2): 0.5 mg via INTRAVENOUS
  Administered 2014-01-26: 1 mg via INTRAVENOUS

## 2014-01-26 MED ORDER — SODIUM CHLORIDE 0.9 % IJ SOLN
INTRAMUSCULAR | Status: AC
  Filled 2014-01-26: qty 10

## 2014-01-26 MED ORDER — HYDROCODONE-ACETAMINOPHEN 7.5-325 MG PO TABS
1.0000 | ORAL_TABLET | ORAL | Status: DC
  Administered 2014-01-26 – 2014-01-27 (×4): 2 via ORAL
  Administered 2014-01-27: 1 via ORAL
  Administered 2014-01-27 – 2014-01-29 (×10): 2 via ORAL
  Filled 2014-01-26 (×15): qty 2

## 2014-01-26 MED ORDER — ONDANSETRON HCL 4 MG PO TABS
4.0000 mg | ORAL_TABLET | Freq: Four times a day (QID) | ORAL | Status: DC | PRN
  Administered 2014-01-28: 4 mg via ORAL
  Filled 2014-01-26: qty 1

## 2014-01-26 MED ORDER — DIPHENHYDRAMINE HCL 12.5 MG/5ML PO ELIX
25.0000 mg | ORAL_SOLUTION | Freq: Four times a day (QID) | ORAL | Status: DC | PRN
  Administered 2014-01-26 (×2): 12.5 mg via ORAL
  Filled 2014-01-26 (×2): qty 10

## 2014-01-26 MED ORDER — PROPOFOL 10 MG/ML IV BOLUS
INTRAVENOUS | Status: AC
  Filled 2014-01-26: qty 20

## 2014-01-26 MED ORDER — DEXAMETHASONE SODIUM PHOSPHATE 10 MG/ML IJ SOLN
INTRAMUSCULAR | Status: DC | PRN
  Administered 2014-01-26: 10 mg via INTRAVENOUS

## 2014-01-26 MED ORDER — ROCURONIUM BROMIDE 100 MG/10ML IV SOLN
INTRAVENOUS | Status: DC | PRN
  Administered 2014-01-26 (×2): 10 mg via INTRAVENOUS
  Administered 2014-01-26: 40 mg via INTRAVENOUS

## 2014-01-26 MED ORDER — HYDROMORPHONE HCL PF 1 MG/ML IJ SOLN
0.5000 mg | INTRAMUSCULAR | Status: DC | PRN
  Administered 2014-01-26: 1 mg via INTRAVENOUS
  Filled 2014-01-26: qty 1

## 2014-01-26 MED ORDER — NEOSTIGMINE METHYLSULFATE 10 MG/10ML IV SOLN
INTRAVENOUS | Status: AC
Start: 2014-01-26 — End: 2014-01-26
  Filled 2014-01-26: qty 1

## 2014-01-26 MED ORDER — DEXAMETHASONE SODIUM PHOSPHATE 10 MG/ML IJ SOLN
10.0000 mg | Freq: Once | INTRAMUSCULAR | Status: DC
  Filled 2014-01-26: qty 1

## 2014-01-26 MED ORDER — FENTANYL CITRATE 0.05 MG/ML IJ SOLN
INTRAMUSCULAR | Status: AC
  Filled 2014-01-26: qty 5

## 2014-01-26 MED ORDER — ROCURONIUM BROMIDE 100 MG/10ML IV SOLN
INTRAVENOUS | Status: AC
  Filled 2014-01-26: qty 1

## 2014-01-26 MED ORDER — ASPIRIN EC 325 MG PO TBEC
325.0000 mg | DELAYED_RELEASE_TABLET | Freq: Two times a day (BID) | ORAL | Status: DC
Start: 2014-01-26 — End: 2014-01-29
  Administered 2014-01-26 – 2014-01-29 (×6): 325 mg via ORAL
  Filled 2014-01-26 (×8): qty 1

## 2014-01-26 MED ORDER — SODIUM CHLORIDE 0.9 % IV SOLN
INTRAVENOUS | Status: DC
  Administered 2014-01-26: 14:00:00 via INTRAVENOUS
  Filled 2014-01-26 (×7): qty 1000

## 2014-01-26 MED ORDER — ALUM & MAG HYDROXIDE-SIMETH 200-200-20 MG/5ML PO SUSP: 30.0000 mL | ORAL | Status: DC | PRN

## 2014-01-26 MED ORDER — PROMETHAZINE HCL 25 MG/ML IJ SOLN: 6.2500 mg | INTRAMUSCULAR | Status: DC | PRN

## 2014-01-26 MED ORDER — DEXTROSE 5 % IV SOLN
500.0000 mg | Freq: Four times a day (QID) | INTRAVENOUS | Status: DC | PRN
  Administered 2014-01-26: 500 mg via INTRAVENOUS
  Filled 2014-01-26: qty 5

## 2014-01-26 MED ORDER — TRANEXAMIC ACID 100 MG/ML IV SOLN
1000.0000 mg | INTRAVENOUS | Status: AC
  Administered 2014-01-26: 1000 mg via INTRAVENOUS
  Filled 2014-01-26: qty 10

## 2014-01-26 MED ORDER — EPHEDRINE SULFATE 50 MG/ML IJ SOLN
INTRAMUSCULAR | Status: AC
  Filled 2014-01-26: qty 1

## 2014-01-26 SURGICAL SUPPLY — 35 items
BAG ZIPLOCK 12X15 (MISCELLANEOUS) IMPLANT
CAPT HIP CERAMAX ×2 IMPLANT
COVER PERINEAL POST (MISCELLANEOUS) ×2 IMPLANT
DERMABOND ADVANCED (GAUZE/BANDAGES/DRESSINGS) ×1
DERMABOND ADVANCED .7 DNX12 (GAUZE/BANDAGES/DRESSINGS) ×1 IMPLANT
DRAPE C-ARM 42X120 X-RAY (DRAPES) ×2 IMPLANT
DRAPE STERI IOBAN 125X83 (DRAPES) ×2 IMPLANT
DRAPE U-SHAPE 47X51 STRL (DRAPES) ×6 IMPLANT
DRSG AQUACEL AG ADV 3.5X10 (GAUZE/BANDAGES/DRESSINGS) ×2 IMPLANT
DURAPREP 26ML APPLICATOR (WOUND CARE) ×2 IMPLANT
ELECT BLADE TIP CTD 4 INCH (ELECTRODE) ×2 IMPLANT
ELECT PENCIL ROCKER SW 15FT (MISCELLANEOUS) ×2 IMPLANT
ELECT REM PT RETURN 15FT ADLT (MISCELLANEOUS) ×2 IMPLANT
FACESHIELD WRAPAROUND (MASK) ×8 IMPLANT
GLOVE BIOGEL PI IND STRL 7.5 (GLOVE) ×1 IMPLANT
GLOVE BIOGEL PI IND STRL 8 (GLOVE) ×1 IMPLANT
GLOVE BIOGEL PI INDICATOR 7.5 (GLOVE) ×1
GLOVE BIOGEL PI INDICATOR 8 (GLOVE) ×1
GLOVE ECLIPSE 8.0 STRL XLNG CF (GLOVE) ×2 IMPLANT
GLOVE ORTHO TXT STRL SZ7.5 (GLOVE) ×4 IMPLANT
GOWN SPEC L3 XXLG W/TWL (GOWN DISPOSABLE) ×2 IMPLANT
GOWN STRL REUS W/TWL LRG LVL3 (GOWN DISPOSABLE) ×2 IMPLANT
KIT BASIN OR (CUSTOM PROCEDURE TRAY) ×2 IMPLANT
LINER BOOT UNIVERSAL DISP (MISCELLANEOUS) ×2 IMPLANT
PACK TOTAL JOINT (CUSTOM PROCEDURE TRAY) ×2 IMPLANT
PADDING CAST COTTON 6X4 STRL (CAST SUPPLIES) ×2 IMPLANT
SAW OSC TIP CART 19.5X105X1.3 (SAW) ×2 IMPLANT
SUT MNCRL AB 4-0 PS2 18 (SUTURE) ×2 IMPLANT
SUT VIC AB 1 CT1 36 (SUTURE) ×6 IMPLANT
SUT VIC AB 2-0 CT1 27 (SUTURE) ×2
SUT VIC AB 2-0 CT1 TAPERPNT 27 (SUTURE) ×2 IMPLANT
SUT VLOC 180 0 24IN GS25 (SUTURE) ×2 IMPLANT
TOWEL OR 17X26 10 PK STRL BLUE (TOWEL DISPOSABLE) ×2 IMPLANT
TOWEL OR NON WOVEN STRL DISP B (DISPOSABLE) IMPLANT
TRAY FOLEY CATH 14FRSI W/METER (CATHETERS) ×2 IMPLANT

## 2014-01-26 NOTE — H&P (View-Only) (Signed)
Megan Robles is an 43 y.o. female.    Chief Complaint: left hip pain  HPI: Pt is a 43 y.o. female complaining of left hip pain for multiple years. Pain had continually increased since the beginning. X-rays in the clinic show end-stage arthritic changes of the left hip. Pt has tried various conservative treatments which have failed to alleviate their symptoms, including injections and therapy. Various options are discussed with the patient. Risks, benefits and expectations were discussed with the patient. Patient understand the risks, benefits and expectations and wishes to proceed with surgery.   PCP:  [name], GILLIAN W, NP  D/C Plans:  SNF/Rehab  PMH: Past Medical History  Diagnosis Date  . Hypertension   . Anemia   . Arthritis   . Fibromyalgia   . Depression   . History of blood transfusion   . Pneumonia     PSH: Past Surgical History  Procedure Laterality Date  . Cholecystectomy  2004  . Dilation and curettage of uterus  2004  . Esophagogastroduodenoscopy  02/15/2012    Procedure: ESOPHAGOGASTRODUODENOSCOPY (EGD);  Surgeon: Theda BelfastPatrick D Hung, MD;  Location[facility];  Service: Endoscopy;  Laterality: N/A;  . Colonoscopy  02/15/2012    Procedure: COLONOSCOPY;  Surgeon: Theda BelfastPatrick D Hung, MD;  [facility];  Service: Endoscopy;  Laterality: N/A;    Social History:  reports that she has never smoked. She has never used smokeless tobacco. She reports that she does not drink alcohol or use illicit drugs.  Allergies:  Allergies  Allergen Reactions  . Banana     Mouth itchy and swelling  . Kiwi Extract     Mouth itchy and swelling  . Pineapple     Mouth itchy    Medications: Current Outpatient Prescriptions  Medication Sig Dispense Refill  . diclofenac sodium (VOLTAREN) 1 % GEL Apply topically 4 (four) times daily.      . DULoxetine (CYMBALTA) 60 MG capsule Take 60 mg by mouth at bedtime.       . ferrous sulfate 325 (65 FE) MG EC tablet Take 325 mg by mouth as  needed.      . hydrochlorothiazide (MICROZIDE) 12.5 MG capsule Take 25 mg by mouth at bedtime.      . indomethacin (INDOCIN) 25 MG capsule Take 50 mg by mouth 4 (four) times daily.       . traMADol (ULTRAM) 50 MG tablet Take 100 mg by mouth 3 (three) times daily.       No current facility-administered medications for this visit.    No results found for this or any previous visit (from the past 48 hour(s)). No results found.  ROS: Pain with rom of the left lower extremity  Physical Exam: BP:   142/84  ;  HR:   80  ; Resp:   14  : Alert and oriented 43 y.o. female in no acute distress Cranial nerves 2-12 intact Cervical spine: full rom with no tenderness, nv intact distally Chest: active breath sounds bilaterally, no wheeze rhonchi or rales Heart: regular rate and rhythm, no murmur Abd: non tender non distended with active bowel sounds Hip is stable with rom  Moderate stiffness and pain with ER and IR of left hip nv intact distally No rashes or edema Minimally antalgic gait  Assessment/Plan Assessment: left Hip OA and pain   Plan: Patient will undergo a left total hip arthroplasty by Dr. Charlann Boxerlin. Risks benefits and expectations were discussed with the patient. Patient understand risks, benefits and  expectations and wishes to proceed.

## 2014-01-26 NOTE — Progress Notes (Signed)
Clinical Social Work Department BRIEF PSYCHOSOCIAL ASSESSMENT 01/26/2014  Patient:  Megan Robles, Megan Robles     Account Number:  [id number]     Admit date:  01/26/2014  Clinical Social Worker:  Lacie Scotts  Date/Time:  01/26/2014 02:39 PM  Referred by:  Physician  Date Referred:  01/26/2014 Referred for  SNF Placement   Other Referral:   Interview type:  Patient Other interview type:    PSYCHOSOCIAL DATA Living Status:  FAMILY Admitted from facility:   Level of care:   Primary support name:  Megan Robles Primary support relationship to patient:  CHILD, ADULT Degree of support available:   supportive    CURRENT CONCERNS Current Concerns  Post-Acute Placement   Other Concerns:    SOCIAL WORK ASSESSMENT / PLAN Pt is a 43 yr old female living at home prior to hospitalization. This is a planned admission. CSW met with pt to assist with d/c planning. Pt feels she will need ST Rehab following hospital d/c. CSW has initiated SNF search in [place] and surrounding counties looking for a medicaid bed. CSW will complete medicaid prior authorization request. SNF bed offers will be provided as received. Pt is aware that 30 days in SNF is required for medicaid to pay for placement.   Assessment/plan status:  Psychosocial Support/Ongoing Assessment of Needs Other assessment/ plan:   Information/referral to community resources:   Insurance coverage for SNF reviewed.    PATIENT'S/FAMILY'S RESPONSE TO PLAN OF CARE: Pt feels rehab is needed. She is interested in several local facilities which CSW will contact. Pt understands that a medicaid bed is needed and search will begin in [place] but may need to be extended. Pt appreciated assistance provided by CSW.    Werner Lean LCSW [phone]

## 2014-01-26 NOTE — Interval H&P Note (Signed)
History and Physical Interval Note:  01/26/2014 7:06 AM  Megan Robles  has presented today for surgery, with the diagnosis of left hip osteoarthritis  The various methods of treatment have been discussed with the patient and family. After consideration of risks, benefits and other options for treatment, the patient has consented to  Procedure(s): LEFT TOTAL HIP ARTHROPLASTY ANTERIOR APPROACH (Left) as a surgical intervention .  The patient's history has been reviewed, patient examined, no change in status, stable for surgery.  I have reviewed the patient's chart and labs.  Questions were answered to the patient's satisfaction.     Shelda PalMatthew D Sky Borboa

## 2014-01-26 NOTE — Anesthesia Postprocedure Evaluation (Signed)
  Anesthesia Post-op Note  Patient: Megan Robles  Procedure(s) Performed: Procedure(s) (LRB): LEFT TOTAL HIP ARTHROPLASTY ANTERIOR APPROACH (Left)  Patient Location: PACU  Anesthesia Type: General  Level of Consciousness: awake and alert   Airway and Oxygen Therapy: Patient Spontanous Breathing  Post-op Pain: mild  Post-op Assessment: Post-op Vital signs reviewed, Patient's Cardiovascular Status Stable, Respiratory Function Stable, Patent Airway and No signs of Nausea or vomiting  Last Vitals:  Filed Vitals:   01/26/14 1420  BP: 136/94  Pulse: 92  Temp: 37.1 C  Resp: 16    Post-op Vital Signs: stable   Complications: No apparent anesthesia complications

## 2014-01-26 NOTE — Progress Notes (Signed)
Clinical Social Work Department CLINICAL SOCIAL WORK PLACEMENT NOTE 01/26/2014  Patient:  Megan Robles,Megan Robles  Account Number:  [id number] Admit date:  01/26/2014  Clinical Social Worker:  Cori RazorJAMIE Jeremias Broyhill, LCSW  Date/time:  01/19/2014 02:53 PM  Clinical Social Work is seeking post-discharge placement for this patient at the following level of care:   SKILLED NURSING   (*CSW will update this form in Epic as items are completed)     Patient/family provided with [facility] of Clinical Social Work's list of facilities offering this level of care within the geographic area requested by the patient (or if unable, by the patient's family).  01/26/2014  Patient/family informed of their freedom to choose among providers that offer the needed level of care, that participate in Medicare, Medicaid or managed care program needed by the patient, have an available bed and are willing to accept the patient.    Patient/family informed of [facility]' ownership interest in [facility], as well as of the fact that they are under no obligation to receive care at this facility.  PASARR submitted to EDS on 01/26/2014 PASARR number received from EDS on 01/26/2014  FL2 transmitted to all facilities in geographic area requested by pt/family on  01/26/2014 FL2 transmitted to all facilities within larger geographic area on   Patient informed that his/her managed care company has contracts with or will negotiate with  certain facilities, including the following:     Patient/family informed of bed offers received:   Patient chooses bed at  Physician recommends and patient chooses bed at    Patient to be transferred to  on   Patient to be transferred to facility by   The following physician request were entered in Epic:   Additional Comments:  Cori RazorJamie Esvin Hnat LCSW [phone]

## 2014-01-26 NOTE — Progress Notes (Signed)
PT Cancellation Note  Patient Details Name: Megan Robles MRN: [id number] DOB: 05/26/1971   Cancelled Treatment:    Reason Eval/Treat Not Completed: Medical issues which prohibited therapy;Pain limiting ability to participate   Rada HayKaren Elizabeth Arloa Prak 01/26/2014, 4:50 PM

## 2014-01-26 NOTE — Anesthesia Preprocedure Evaluation (Addendum)
Anesthesia Evaluation  Patient identified by MRN, date of birth, ID band Patient awake    Reviewed: Allergy & Precautions, H&P , NPO status , Patient's Chart, lab work & pertinent test results  History of Anesthesia Complications (+) AWARENESS UNDER ANESTHESIA  Airway Mallampati: II TM Distance: >3 FB Neck ROM: Full    Dental no notable dental hx.    Pulmonary pneumonia -, resolved,  breath sounds clear to auscultation  Pulmonary exam normal       Cardiovascular hypertension, Pt. on medications Rhythm:Regular Rate:Normal     Neuro/Psych PSYCHIATRIC DISORDERS Depression  Neuromuscular disease    GI/Hepatic negative GI ROS, Neg liver ROS,   Endo/Other  Morbid obesity  Renal/GU negative Renal ROS  negative genitourinary   Musculoskeletal  (+) Fibromyalgia -  Abdominal (+) + obese,   Peds negative pediatric ROS (+)  Hematology  (+) anemia ,   Anesthesia Other Findings   Reproductive/Obstetrics negative OB ROS                          Anesthesia Physical Anesthesia Plan  ASA: III  Anesthesia Plan: General   Post-op Pain Management:    Induction: Intravenous  Airway Management Planned: Oral ETT  Additional Equipment:   Intra-op Plan:   Post-operative Plan: Extubation in OR  Informed Consent: I have reviewed the patients History and Physical, chart, labs and discussed the procedure including the risks, benefits and alternatives for the proposed anesthesia with the patient or authorized representative who has indicated his/her understanding and acceptance.   Dental advisory given  Plan Discussed with: CRNA  Anesthesia Plan Comments: (Discussed general and spinal. Patient prefers general. )        Anesthesia Quick Evaluation

## 2014-01-26 NOTE — Transfer of Care (Signed)
Immediate Anesthesia Transfer of Care Note  Patient: Megan Robles  Procedure(s) Performed: Procedure(s): LEFT TOTAL HIP ARTHROPLASTY ANTERIOR APPROACH (Left)  Patient Location: PACU  Anesthesia Type:General  Level of Consciousness: awake, alert , oriented and patient cooperative  Airway & Oxygen Therapy: Patient Spontanous Breathing and Patient connected to face mask oxygen  Post-op Assessment: Report given to PACU RN, Post -op Vital signs reviewed and stable and Patient moving all extremities  Post vital signs: Reviewed and stable  Complications: No apparent anesthesia complications

## 2014-01-26 NOTE — Plan of Care (Signed)
Problem: Consults Goal: Diagnosis- Total Joint Replacement Left anterior hip     

## 2014-01-26 NOTE — Op Note (Signed)
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      SURGEON:  Madlyn FrankelMatthew D. Charlann Boxerlin, M.D.      ASSISTANT: Leilani AbleSteve Chabon, PA-C      ANESTHESIA:  General.      SPECIMENS:  None.      COMPLICATIONS:  None.      BLOOD LOSS:  950 cc     DRAINS:  none.      INDICATION OF THE PROCEDURE:  Megan Robles is a [age] y.o. female who had   presented to office for evaluation of left hip pain.  Radiographs revealed   progressive degenerative changes with bone-on-bone   articulation to the  hip joint, dysplasic changes with proximal migration and shortening of the left lower extremity related to the right.  The patient had painful limited range of   motion significantly affecting their overall quality of life.  The patient was failing to    respond to conservative measures, and at this point was ready   to proceed with more definitive measures.  The patient has noted progressive   degenerative changes in his hip, progressive problems and dysfunction   with regarding the hip prior to surgery.  Consent was obtained for   benefit of pain relief.  Specific risk of infection, DVT, component   failure, dislocation, need for revision surgery, as well discussion of   the anterior versus posterior approach were reviewed.  Consent was   obtained for benefit of anterior pain  relief through an anterior   approach.      PROCEDURE IN DETAIL:  The patient was brought to operative theater.   Once adequate anesthesia, preoperative antibiotics, 2gm Ancef administered.   The patient was positioned supine on the OSI Hanna table.  Once adequate   padding of boney process was carried out, we had predraped out the hip, and  used fluoroscopy to confirm orientation of the pelvis and position.      The left hip was then prepped and draped from proximal iliac crest to   mid thigh with shower curtain technique.      Time-out was performed identifying the patient, planned procedure, and   extremity.     An incision was then made 2 cm distal and lateral to the   anterior superior iliac spine extending over the orientation of the   tensor fascia lata muscle and sharp dissection was carried down to the   fascia of the muscle and protractor placed in  the soft tissues.      The fascia was then incised.  The muscle belly was identified and swept   laterally and retractor placed along the superior neck.  Following   cauterization of the circumflex vessels and removing some pericapsular   fat, a second cobra retractor was placed on the inferior neck.  A third   retractor was placed on the anterior acetabulum after elevating the   anterior rectus.  A L-capsulotomy was along the line of the   superior neck to the trochanteric fossa, then extended proximally and   distally.  Tag sutures were placed and the retractors were then placed   intracapsular.  We then identified the trochanteric fossa and   orientation of my neck cut, confirmed this radiographically   and then made a neck osteotomy with the femur on traction.  The femoral   head was removed without difficulty or complication.  Traction was let   off and retractors were placed posterior and anterior around the   acetabulum.      The labrum and foveal tissue were debrided.  I began reaming with a 47mm   reamer and reamed up  to 51mm reamer with good bony bed preparation and a 52   cup was chosen.  The final 52mm Pinnacle cup was then impacted under fluoroscopy  to confirm the depth of penetration and orientation with respect to   abduction.  A screw was placed followed by the hole eliminator.  The final   36 neutral Delta ceramic liner was impacted with good visualized rim fit and no complication.  The cup was positioned anatomically within the acetabular portion of the pelvis.      At this point, the femur was rolled at 80 degrees.  Further capsule was   released off the inferior aspect of the femoral neck.  I then   released the superior capsule proximally.  The hook was placed laterally   along the femur and elevated manually and held in position with the bed   hook.  The leg was then extended and adducted with the leg rolled to 100   degrees of external rotation.  Once the proximal femur was fully   exposed, I used a box osteotome to set orientation.  I then began   broaching with the starting chili pepper broach and passed this by hand and then broached up to 5.  With the 5 broach in place I chose a standard offset neck and did a trial reduction.  The offset was appropriate, leg lengths   appeared to be equal.  I feel I was able to nearly match her leg lengths to restore what she had presented with regards to her dysplastic shortening.   Given these findings, I went ahead and dislocated the hip, repositioned all   retractors and positioned the right hip in the extended and abducted position.  The final 5 standard offset Tri Lock stem was   chosen and it was impacted down to the level of neck cut.  Based on this   and the trial reduction, a 36+1.5 delta ceramic ball was chosen and   impacted onto a clean and dry trunnion, and the hip was reduced.  The   hip had been irrigated throughout the case again at this point.  I did   reapproximate the superior capsular leaflet to the anterior leaflet   using #1 Vicryl.   The fascia of the   tensor fascia lata muscle was then  reapproximated using #1 Vicryl and #0 V-lock sutures.  The   remaining wound was closed with 2-0 Vicryl and running 4-0 Monocryl.   The hip was cleaned, dried, and dressed sterilely using Dermabond and   Aquacel dressing.  She was then brought   to recovery room in stable condition tolerating the procedure well.    Leilani AbleSteve Chabon, PA-C was present for the entirety of the case involved from   preoperative positioning, perioperative retractor management, general   facilitation of the case, as well as primary wound closure as assistant.            Madlyn FrankelMatthew D. Charlann Boxerlin, M.D.        01/26/2014 10:17 AM

## 2014-01-27 LAB — CBC
HEMATOCRIT: 25.3 % — AB (ref 36.0–46.0)
HEMOGLOBIN: 7.7 g/dL — AB (ref 12.0–15.0)
MCH: 23.8 pg — AB (ref 26.0–34.0)
MCHC: 30.4 g/dL (ref 30.0–36.0)
MCV: 78.1 fL (ref 78.0–100.0)
Platelets: 338 10*3/uL (ref 150–400)
RBC: 3.24 MIL/uL — ABNORMAL LOW (ref 3.87–5.11)
RDW: 15.9 % — AB (ref 11.5–15.5)
WBC: 9 10*3/uL (ref 4.0–10.5)

## 2014-01-27 LAB — BASIC METABOLIC PANEL
BUN: 6 mg/dL (ref 6–23)
CALCIUM: 8.8 mg/dL (ref 8.4–10.5)
CO2: 27 mEq/L (ref 19–32)
Chloride: 101 mEq/L (ref 96–112)
Creatinine, Ser: 0.67 mg/dL (ref 0.50–1.10)
Glucose, Bld: 117 mg/dL — ABNORMAL HIGH (ref 70–99)
POTASSIUM: 3.7 meq/L (ref 3.7–5.3)
SODIUM: 137 meq/L (ref 137–147)

## 2014-01-27 NOTE — Progress Notes (Signed)
CSW met with patient & 43 year old daughter, [name] via phone to discuss SNF bed offers - patient only had bed offers at [facility]. Patient was disappointed there wasn't a bed offer from [facility], [facility] [place] or [facility]. Patient aware that she would need to stay atleast 30 days in order for Medicaid to cover SNF stay. Patient to discuss with daughter when she visits this evening - most likely will opt to go home at discharge rather than SNF.  CSW will make RNCM aware.   Clinical Social Work Department CLINICAL SOCIAL WORK PLACEMENT NOTE 01/27/2014  Patient:  Megan Robles, Megan Robles  Account Number:  [id number] Admit date:  01/26/2014  Clinical Social Worker:  Werner Lean, LCSW  Date/time:  01/19/2014 02:53 PM  Clinical Social Work is seeking post-discharge placement for this patient at the following level of care:   SKILLED NURSING   (*CSW will update this form in Epic as items are completed)     Patient/family provided with [facility] Department of Clinical Social Work's list of facilities offering this level of care within the geographic area requested by the patient (or if unable, by the patient's family).  01/26/2014  Patient/family informed of their freedom to choose among providers that offer the needed level of care, that participate in Medicare, Medicaid or managed care program needed by the patient, have an available bed and are willing to accept the patient.    Patient/family informed of [facility]' ownership interest in [facility], as well as of the fact that they are under no obligation to receive care at this facility.  PASARR submitted to EDS on 01/26/2014 PASARR number received from EDS on 01/26/2014  FL2 transmitted to all facilities in geographic area requested by pt/family on  01/26/2014 FL2 transmitted to all facilities within larger geographic area on   Patient informed that his/her managed care company has contracts  with or will negotiate with  certain facilities, including the following:     Patient/family informed of bed offers received:  01/27/2014 Patient chooses bed at  Physician recommends and patient chooses bed at    Patient to be transferred to  on   Patient to be transferred to facility by   The following physician request were entered in Epic:   Additional Comments:   Raynaldo Opitz, [place] Social Worker cell #: [phone]

## 2014-01-27 NOTE — Progress Notes (Signed)
Physical Therapy Treatment Patient Details Name: Hewitt Bladeara L Diallo MRN: [id number] DOB: 02/24/1971 Today's Date: 01/27/2014    History of Present Illness s/p L Direct Anterior  THA    PT Comments    Pt progressing well, gait is slow  With 4 wheeed  RW without difficulty.  Pt will benefit from  Post acute rehab .  Follow Up Recommendations  SNF;Supervision/Assistance - 24 hour     Equipment Recommendations  None recommended by PT    Recommendations for Other Services       Precautions / Restrictions Precautions Precautions: Fall Precaution Comments: HGb in 7's, watch  for hypotension    Mobility  Bed Mobility Overal bed mobility: Needs Assistance Bed Mobility: Supine to Sit;Sit to Supine     Supine to sit: Min assist;HOB elevated Sit to supine: Min assist;HOB elevated   General bed mobility comments: rail used, leg lifter used for moving LLE  to edge of bed with extra time moving to L. Leg lifter and mod assistance to get LLE onto bed from L side.  Transfers Overall transfer level: Needs assistance Equipment used: 4-wheeled walker   Sit to Stand: Min assist;+2 safety/equipment         General transfer comment: + 2 for pt's safety due to HGB being 7.7 and pt c/o feeling dizziness in bed but did not increase as pt progressed. Cues for  UE  and LLE position.Marland Kitchen. Pt practiced turning around and sitting down on seat of RW x 1 for  handles to be adjusted.  Ambulation/Gait Ambulation/Gait assistance: +2 safety/equipment Ambulation Distance (Feet): 200 Feet Assistive device: 4-wheeled walker Gait Pattern/deviations: Step-to pattern;Antalgic Gait velocity: decr.    General Gait Details: tends to "dip " down on RLE, noted genu valgus present on R knee   Stairs            Wheelchair Mobility    Modified Rankin (Stroke Patients Only)       Balance                                    Cognition Arousal/Alertness: Awake/alert                          Exercises Total Joint Exercises Ankle Circles/Pumps: AROM Quad Sets: AROM;Left;10 reps;Supine Short Arc Quad: AROM;Left;10 reps;Supine Heel Slides: AAROM;Left;15 reps;Supine Hip ABduction/ADduction: AAROM;Left;15 reps;Supine    General Comments        Pertinent Vitals/Pain  Pt reports that   She feels pain along lateral thigh more than anterior thigh. Premedicated , ice applied.    Home Living                      Prior Function            PT Goals (current goals can now be found in the care plan section) Progress towards PT goals: Progressing toward goals    Frequency  7X/week    PT Plan Current plan remains appropriate    Co-evaluation             End of Session   Activity Tolerance: Patient tolerated treatment well Patient left: in bed;with call bell/phone within reach     Time: 1357-1500 PT Time Calculation (min): 63 min  Charges:  $Gait Training: 23-37 mins $Therapeutic Exercise: 8-22 mins $Self Care/Home Management: 8-22  G Codes:      Rada HayKaren Elizabeth Ziasia Lenoir 01/27/2014, 3:51 PM

## 2014-01-27 NOTE — Progress Notes (Signed)
   Subjective: 1 Day Post-Op Procedure(s) (LRB): LEFT TOTAL HIP ARTHROPLASTY ANTERIOR APPROACH (Left)   Patient reports pain as moderate, pain controlled. No events throughout the night. Discussed the possibility of going to a skilled nursing facility upon discharge.  Objective:   VITALS:   Filed Vitals:   01/27/14 1012  BP: 165/61  Pulse: 100  Temp: 97.8 F (36.6 C)  Resp: 20    Neurovascular intact Dorsiflexion/Plantar flexion intact Incision: dressing C/D/I No cellulitis present Compartment soft  LABS  Recent Labs  01/27/14 0525  HGB 7.7*  HCT 25.3*  WBC 9.0  PLT 338     Recent Labs  01/27/14 0525  NA 137  K 3.7  BUN 6  CREATININE 0.67  GLUCOSE 117*     Assessment/Plan: 1 Day Post-Op Procedure(s) (LRB): LEFT TOTAL HIP ARTHROPLASTY ANTERIOR APPROACH (Left) Advance diet Up with therapy D/C IV fluids Discharge to SNF eventually, when ready  Expected ABLA  Treated with iron and will observe  Morbid Obesity (BMI >40)  Estimated body mass index is 48.04 kg/(m^2) as calculated from the following:   Height as of this encounter: 5\' 4"  (1.626 m).   Weight as of this encounter: 127.007 kg (280 lb). Patient also counseled that weight may inhibit the healing process Patient counseled that losing weight will help with future health issues     Anastasio AuerbachMatthew S. Yalonda Sample   PAC  01/27/2014, 10:35 AM

## 2014-01-27 NOTE — Progress Notes (Signed)
Physical Therapy Treatment Patient Details Name: Megan Robles MRN: [id number] DOB: 11/05/1970 Today's Date: 01/27/2014    History of Present Illness s/p L Direct Anterior  THA    PT Comments    Pt tolerated very well, no S/S hypotension, HR 127. Pt will benefit from post acute rehab. Pt will benefit from PT to address problems listed.  Follow Up Recommendations  SNF;Supervision/Assistance - 24 hour     Equipment Recommendations  None recommended by PT    Recommendations for Other Services       Precautions / Restrictions Precautions Precautions: Fall Precaution Comments: HGb in 7's, watch  for hypotension Restrictions Weight Bearing Restrictions: No    Mobility  Bed Mobility Overal bed mobility: Needs Assistance Bed Mobility: Supine to Sit     Supine to sit: Mod assist;HOB elevated     General bed mobility comments: rail used, sheet used to slide L leg to edge of bed. pt able to sit up to edge.  Transfers Overall transfer level: Needs assistance Equipment used: 4-wheeled walker Transfers: Sit to/from Stand Sit to Stand: Min assist;From elevated surface         General transfer comment: cues for  hand and LLE position, bed raised to assist standing.  Ambulation/Gait Ambulation/Gait assistance: +2 safety/equipment;Min assist Ambulation Distance (Feet): 48 Feet Assistive device: 4-wheeled walker Gait Pattern/deviations: Step-to pattern;Antalgic Gait velocity: decr.       Stairs            Wheelchair Mobility    Modified Rankin (Stroke Patients Only)       Balance                                    Cognition Arousal/Alertness: Awake/alert Behavior During Therapy: WFL for tasks assessed/performed Overall Cognitive Status: Within Functional Limits for tasks assessed                      Exercises      General Comments        Pertinent Vitals/Pain Pain 3, increased to 5 moving.HR 127.    Home Living  Family/patient expects to be discharged to:: Skilled nursing facility Living Arrangements: Children   Type of Home: Apartment Home Access: Level entry   Home Layout: One level Home Equipment: Walker - 4 wheels Additional Comments: pt  will have R knee surgery next    Prior Function Level of Independence: Independent with assistive device(s)          PT Goals (current goals can now be found in the care plan section) Acute Rehab PT Goals Patient Stated Goal: I want to get up and walk PT Goal Formulation: With patient Time For Goal Achievement: 02/03/14 Potential to Achieve Goals: Good    Frequency  7X/week    PT Plan      Co-evaluation PT/OT/SLP Co-Evaluation/Treatment: Yes Reason for Co-Treatment: For patient/therapist safety PT goals addressed during session: Mobility/safety with mobility       End of Session Equipment Utilized During Treatment: Gait belt Activity Tolerance: Patient tolerated treatment well Patient left: in chair;with call bell/phone within reach     Time: 0809-0842 PT Time Calculation (min): 33 min  Charges:  $Gait Training: 8-22 mins                    G Codes:      Rada HayKaren Elizabeth Cleotha Tsang 01/27/2014, 8:59 AM Blanchard KelchKaren Damico Partin  PT [phone]

## 2014-01-27 NOTE — Progress Notes (Signed)
Utilization review completed.  

## 2014-01-27 NOTE — Evaluation (Signed)
Occupational Therapy Evaluation Patient Details Name: Hewitt Bladeara L Diallo MRN: [id number] DOB: 02/20/1971 Today's Date: 01/27/2014    History of Present Illness s/p L Direct Anterior  THA   Clinical Impression   This 43 year old female was admitted for L DA THA.  She will benefit from skilled OT to increase independence and safety with adls/toilet transfers.  Goals in acute are for supervision level.  She was mod I prior to admission    Follow Up Recommendations  SNF    Equipment Recommendations  3 in 1 bedside comode    Recommendations for Other Services       Precautions / Restrictions Precautions Precautions: Fall Precaution Comments: HGb in 7's, watch  for hypotension Restrictions Weight Bearing Restrictions: No      Mobility Bed Mobility Overal bed mobility: Needs Assistance Bed Mobility: Supine to Sit     Supine to sit: Mod assist;HOB elevated     General bed mobility comments: rail used, sheet used to slide L leg to edge of bed. pt able to sit up to edge.  Transfers Overall transfer level: Needs assistance Equipment used: 4-wheeled walker Transfers: Sit to/from Stand Sit to Stand: Min assist         General transfer comment: cues for  hand and LLE position, bed raised to assist standing.    Balance                                            ADL Overall ADL's : Needs assistance/impaired         Upper Body Bathing: Set up;Sitting   Lower Body Bathing: Sit to/from stand;Minimal assistance;With adaptive equipment   Upper Body Dressing : Set up;Sitting   Lower Body Dressing: Sit to/from stand;Moderate assistance;With adaptive equipment   Toilet Transfer: +2 for safety/equipment;Minimal assistance (ambulating due to decreased Hgb)           Functional mobility during ADLs: +2 for safety/equipment;Minimal assistance (4 wheel walker) General ADL Comments: Pt educated on AE and issued to her.  She practiced with reacher and sock aid      Vision                     Perception     Praxis      Pertinent Vitals/Pain Initially 3/10, increased to 5/10, R hip.  Repositioned and ice applied     Hand Dominance     Extremity/Trunk Assessment Upper Extremity Assessment Upper Extremity Assessment: Overall WFL for tasks assessed (has arthritis both shoulders)   Lower Extremity Assessment Lower Extremity Assessment: LLE deficits/detail;RLE deficits/detail RLE Deficits / Details: pt reports that R knee cartilage is shot LLE Deficits / Details:  , used  sheet tio move leg to edge of bed, able to advance  leg during ambulation       Communication Communication Communication: No difficulties   Cognition Arousal/Alertness: Awake/alert Behavior During Therapy: WFL for tasks assessed/performed Overall Cognitive Status: Within Functional Limits for tasks assessed                     General Comments       Exercises       Shoulder Instructions      Home Living Family/patient expects to be discharged to:: Skilled nursing facility Living Arrangements: Children   Type of Home: Apartment Home Access: Level entry  Home Layout: One level               Home Equipment: Walker - 4 wheels   Additional Comments: plans snf; has a standard commode and tub at home      Prior Functioning/Environment Level of Independence: Independent with assistive device(s)             OT Diagnosis: Generalized weakness   OT Problem List: Decreased strength;Decreased activity tolerance;Decreased knowledge of use of DME or AE;Pain   OT Treatment/Interventions: Self-care/ADL training;DME and/or AE instruction;Patient/family education    OT Goals(Current goals can be found in the care plan section) Acute Rehab OT Goals Patient Stated Goal: I want to get up and walk OT Goal Formulation: With patient Time For Goal Achievement: 02/03/14 Potential to Achieve Goals: Good ADL Goals Pt Will Perform Lower  Body Bathing: with supervision;sit to/from stand Pt Will Perform Lower Body Dressing: with supervision;sit to/from stand Pt Will Transfer to Toilet: with supervision;bedside commode;ambulating Pt Will Perform Toileting - Clothing Manipulation and hygiene: with supervision;sit to/from stand  OT Frequency: Min 2X/week   Barriers to D/C:            Co-evaluation PT/OT/SLP Co-Evaluation/Treatment: Yes Reason for Co-Treatment: For patient/therapist safety PT goals addressed during session: Mobility/safety with mobility OT goals addressed during session: Proper use of Adaptive equipment and DME;ADL's and self-care      End of Session    Activity Tolerance: Patient tolerated treatment well Patient left: in chair;with call bell/phone within reach   Time: [id number] OT Time Calculation (min): 24 min Charges:  OT General Charges $OT Visit: 1 Procedure OT Evaluation $Initial OT Evaluation Tier I: 1 Procedure OT Treatments $Self Care/Home Management : 8-22 mins G-Codes:    Marica OtterMaryellen Aranda Bihm 01/27/2014, 9:26 AM Marica OtterMaryellen Tayona Sarnowski, OTR/L [phone] 01/27/2014

## 2014-01-28 ENCOUNTER — Encounter (HOSPITAL_COMMUNITY): Payer: Self-pay | Admitting: Orthopedic Surgery

## 2014-01-28 DIAGNOSIS — D500 Iron deficiency anemia secondary to blood loss (chronic): Secondary | ICD-10-CM | POA: Diagnosis not present

## 2014-01-28 LAB — BASIC METABOLIC PANEL
BUN: 7 mg/dL (ref 6–23)
CALCIUM: 8.5 mg/dL (ref 8.4–10.5)
CO2: 28 mEq/L (ref 19–32)
Chloride: 99 mEq/L (ref 96–112)
Creatinine, Ser: 0.7 mg/dL (ref 0.50–1.10)
GFR calc non Af Amer: >90 mL/min (ref >90)
Glucose, Bld: 117 mg/dL — ABNORMAL HIGH (ref 70–99)
POTASSIUM: 3.6 meq/L — AB (ref 3.7–5.3)
SODIUM: 136 meq/L — AB (ref 137–147)

## 2014-01-28 LAB — CBC
HCT: 23.5 % — ABNORMAL LOW (ref 36.0–46.0)
HEMOGLOBIN: 7.2 g/dL — AB (ref 12.0–15.0)
MCH: 23.8 pg — ABNORMAL LOW (ref 26.0–34.0)
MCHC: 30.6 g/dL (ref 30.0–36.0)
MCV: 77.6 fL — ABNORMAL LOW (ref 78.0–100.0)
Platelets: 339 10*3/uL (ref 150–400)
RBC: 3.03 MIL/uL — ABNORMAL LOW (ref 3.87–5.11)
RDW: 16.3 % — ABNORMAL HIGH (ref 11.5–15.5)
WBC: 10 10*3/uL (ref 4.0–10.5)

## 2014-01-28 LAB — PREPARE RBC (CROSSMATCH)

## 2014-01-28 NOTE — Care Management Note (Unsigned)
    Page 1 of 2   01/28/2014     10:34:15 AM CARE MANAGEMENT NOTE 01/28/2014  Patient:  Megan Robles,Megan Robles   Account Number:  [id number]  Date Initiated:  01/28/2014  Documentation [facility]:  [facility]Oshay Stranahan  Subjective/Objective Assessment:   adm: left hip pain/LEFT TOTAL HIP ARTHROPLASTY ANTERIOR APPROACH     Action/Plan:   discharge planning   Anticipated DC Date:  01/29/2014   Anticipated DC Plan:  HOME W HOME HEALTH SERVICES      DC Planning Services  CM consult      [facility]   Choice offered to / List presented to:  C-1 Patient   DME arranged  3-N-1  Ronita HippsSHOWER STOOL      DME agency  [facility]     HH arranged  HH[facility]2 [facility]      [facility]   Status of service:  In process, will continue to follow Medicare Important Message given?   (If response is "NO", the following Medicare IM given date fields will be blank) Date Medicare IM given:   Date Additional Medicare IM given:    Discharge Disposition:    Per UR Regulation:    If discussed at Long Length of Stay Meetings, dates discussed:    Comments:  01/28/14 10:30 CM spoke with pt in room to offer choice of home health agency to render HHPT.  Pt requests 3n1 and shower seat.  Requested F2F/orders.  CM [facility]ed [facility], Kristen.  Will continue to monitor for disposition.  Freddy JakschSarah Burnetta Kohls, BSN, CM [phone].

## 2014-01-28 NOTE — Progress Notes (Signed)
   Subjective: 2 Days Post-Op Procedure(s) (LRB): LEFT TOTAL HIP ARTHROPLASTY ANTERIOR APPROACH (Left)   Patient reports pain as mild, pain controlled. No events throughout the night. States that she will consider returning home after discharge from hospital.  She states that she is feeling dizzy this morning, which she states that she has known anemia issue when she has her menses which started today. Her H&H has already been low post-op.  Objective:   VITALS:   Filed Vitals:   01/28/14 0600  BP: 135/96  Pulse: 99  Temp: 99.3 F (37.4 C)  Resp: 18    Neurovascular intact Dorsiflexion/Plantar flexion intact Incision: dressing C/D/I No cellulitis present Compartment soft  LABS  Recent Labs  01/27/14 0525 01/28/14 0437  HGB 7.7* 7.2*  HCT 25.3* 23.5*  WBC 9.0 10.0  PLT 338 339     Recent Labs  01/27/14 0525 01/28/14 0437  NA 137 136*  K 3.7 3.6*  BUN 6 7  CREATININE 0.67 0.70  GLUCOSE 117* 117*     Assessment/Plan: 2 Days Post-Op Procedure(s) (LRB): LEFT TOTAL HIP ARTHROPLASTY ANTERIOR APPROACH (Left) Up with therapy Discharge home with home health eventually when ready  ABLA  Treated with 2 units of blood and will follow labs tomorrow   Morbid Obesity (BMI >40)  Estimated body mass index is 48.04 kg/(m^2) as calculated from the following:      Height as of this encounter: 5\' 4"  (1.626 m).      Weight as of this encounter: 127.007 kg (280 lb).  Patient also counseled that weight may inhibit the healing process  Patient counseled that losing weight will help with future health issues      Anastasio AuerbachMatthew S. Calli Bashor   PAC  01/28/2014, 9:06 AM

## 2014-01-28 NOTE — Progress Notes (Signed)
Physical Therapy Treatment Patient Details Name: Megan Robles MRN: [id number] DHewitt BladeOB: 10/10/1970 Today's Date: 01/28/2014    History of Present Illness s/p L Direct Anterior  THA    PT Comments    POD # 2 pm session.  Assisted pt OOB to amb to BR then amb in hallway.  Pt doing better after one unit blood.  No c/o dizziness and BP stand was 138/78.  Follow Up Recommendations  Home health PT     Equipment Recommendations  None recommended by PT    Recommendations for Other Services       Precautions / Restrictions Precautions Precautions: Fall Restrictions Weight Bearing Restrictions: No    Mobility  Bed Mobility Overal bed mobility: Modified Independent       Supine to sit: Modified independent (Device/Increase time) Sit to supine: Modified independent (Device/Increase time)   General bed mobility comments: increased time and uses leg lifter to self assist  Transfers Overall transfer level: Needs assistance Equipment used: 4-wheeled walker Transfers: Sit to/from Stand Sit to Stand: Supervision         General transfer comment: increased time and good use of hands to steady self.  Ambulation/Gait Ambulation/Gait assistance: Supervision Ambulation Distance (Feet): 200 Feet (with 2 sitting rest breaks) Assistive device: 4-wheeled walker Gait Pattern/deviations: Step-to pattern Gait velocity: decr.    General Gait Details: amb with R shoe to decrease leg length discrepancy   Stairs            Wheelchair Mobility    Modified Rankin (Stroke Patients Only)       Balance                                    Cognition                            Exercises      General Comments        Pertinent Vitals/Pain     Home Living                      Prior Function            PT Goals (current goals can now be found in the care plan section) Progress towards PT goals: Progressing toward goals    Frequency  7X/week    PT Plan      Co-evaluation             End of Session Equipment Utilized During Treatment: Gait belt Activity Tolerance: Patient tolerated treatment well Patient left: in bed;with call bell/phone within reach     Time: 1440-1505 PT Time Calculation (min): 25 min  Charges:  $Gait Training: 8-22 mins $Therapeutic Activity: 8-22 mins                    G Codes:      Felecia ShellingLori Junita Kubota  PTA [facility]  Acute  Rehab Pager      [phone]

## 2014-01-28 NOTE — Plan of Care (Signed)
Problem: Consults Goal: Diagnosis- Total Joint Replacement Outcome: Completed/Met Date Met:  01/28/14 Primary Total Hip RIGHT, Anterior

## 2014-01-28 NOTE — Progress Notes (Signed)
OT Cancellation Note  Patient Details Name: Megan Robles MRN: [id number] DOB: 12/10/1970   Cancelled Treatment:    Reason Eval/Treat Not Completed: Other (comment).  Spoke to Lowe's CompaniesMatt Babish.  Plan is for blood--pt symptomatic.  If schedule permits, will check back this pm  [facility]CMaryellen Kamani Magnussen 01/28/2014, 8:38 AM Marica OtterMaryellen Mariyanna Mucha, OTR/L [phone] 01/28/2014

## 2014-01-29 LAB — TYPE AND SCREEN
ABO/RH(D): O POS
ANTIBODY SCREEN: NEGATIVE
UNIT DIVISION: 0
Unit division: 0

## 2014-01-29 LAB — CBC
HEMATOCRIT: 25.4 % — AB (ref 36.0–46.0)
Hemoglobin: 8.2 g/dL — ABNORMAL LOW (ref 12.0–15.0)
MCH: 25.2 pg — ABNORMAL LOW (ref 26.0–34.0)
MCHC: 32.3 g/dL (ref 30.0–36.0)
MCV: 78.2 fL (ref 78.0–100.0)
Platelets: 319 10*3/uL (ref 150–400)
RBC: 3.25 MIL/uL — ABNORMAL LOW (ref 3.87–5.11)
RDW: 16.1 % — AB (ref 11.5–15.5)
WBC: 8.5 10*3/uL (ref 4.0–10.5)

## 2014-01-29 MED ORDER — ASPIRIN 325 MG PO TBEC: 325.0000 mg | DELAYED_RELEASE_TABLET | Freq: Two times a day (BID) | ORAL | Status: AC

## 2014-01-29 MED ORDER — METHOCARBAMOL 500 MG PO TABS: 500.0000 mg | ORAL_TABLET | Freq: Four times a day (QID) | ORAL | Status: DC | PRN

## 2014-01-29 MED ORDER — HYDROCODONE-ACETAMINOPHEN 7.5-325 MG PO TABS: 1.0000 | ORAL_TABLET | ORAL | Status: DC | PRN

## 2014-01-29 MED ORDER — DSS 100 MG PO CAPS: 100.0000 mg | ORAL_CAPSULE | Freq: Two times a day (BID) | ORAL | Status: DC

## 2014-01-29 MED ORDER — POLYETHYLENE GLYCOL 3350 17 G PO PACK: 17.0000 g | PACK | Freq: Every day | ORAL | Status: DC | PRN

## 2014-01-29 MED ORDER — FERROUS SULFATE 325 (65 FE) MG PO TABS: 325.0000 mg | ORAL_TABLET | Freq: Three times a day (TID) | ORAL | Status: DC

## 2014-01-29 NOTE — Progress Notes (Signed)
Occupational Therapy Treatment Patient Details Name: Megan Robles MRN: [id number] DOB: 17-Mar-1971 Today's Date: 01/29/2014    History of present illness s/p L Direct Anterior  THA   OT comments  Pt making excellent progress with OT.  Used AE for adls and completed at set up level.    Follow Up Recommendations  Home health OT    Equipment Recommendations  3 in 1 bedside comode    Recommendations for Other Services      Precautions / Restrictions Precautions Precautions: Fall Restrictions Weight Bearing Restrictions: No       Mobility Bed Mobility         Supine to sit: Modified independent (Device/Increase time)     General bed mobility comments: increased time and uses leg lifter to self assist  Transfers     Transfers: Sit to/from Stand Sit to Stand: Modified independent (Device/Increase time)              Balance                                   ADL Overall ADL's : Needs assistance/impaired                                       General ADL Comments: pt completed adl at eob with set up/AE sit to stand.  Educated on setting up at home for mod I.  Pt will have 3:1. She doesn't know if tub bench would fit:  HHOT can further assess      Vision                     Perception     Praxis      Cognition   Behavior During Therapy: WFL for tasks assessed/performed Overall Cognitive Status: Within Functional Limits for tasks assessed                       Extremity/Trunk Assessment               Exercises     Shoulder Instructions       General Comments      Pertinent Vitals/ Pain       1 or 2/10  Home Living Family/patient expects to be discharged to:: Private residence Living Arrangements: Alone Available Help at Discharge: Family Type of Home: Apartment             Bathroom Shower/Tub: Tub/shower unit Shower/tub characteristics: Architectural technologist: Standard                 Prior Functioning/Environment Level of Independence: Independent with assistive device(s)            Frequency Min 2X/week     Progress Toward Goals  OT Goals(current goals can now be found in the care plan section)  Progress towards OT goals: Goals met/education completed, patient discharged from OT (toilet goal not addressed up updated)  ADL Goals Pt Will Transfer to Toilet: with modified independence;bedside commode;ambulating Pt Will Perform Toileting - Clothing Manipulation and hygiene: with modified independence;sit to/from stand Additional ADL Goal #1: pt will complete adl at mod I level at sink in bathroom  Plan Discharge plan needs to be updated    Co-evaluation  End of Session     Activity Tolerance Patient tolerated treatment well   Patient Left in bed;with call bell/phone within reach   Nurse Communication          Time: 9558-3167 OT Time Calculation (min): 35 min  Charges: OT General Charges $OT Visit: 1 Procedure OT Treatments $Self Care/Home Management : 23-37 mins  [name] 01/29/2014, 9:30 AM [name], OTR/L [phone] 01/29/2014

## 2014-01-29 NOTE — Progress Notes (Signed)
[facility]  [facility] the [facility]: [facility] Bed and Commode  If patient discharges after hours, please call [phone].   Renard HamperLecretia Williamson 01/29/2014, 11:20 AM

## 2014-01-29 NOTE — Progress Notes (Signed)
Physical Therapy Treatment Patient Details Name: Megan Robles MRN: [id number] DOB: 07/02/1971 Today's Date: 01/29/2014    History of Present Illness s/p L Direct Anterior  THA    PT Comments    POD # 3 assisted pt OOB to amb to BR then in hallway.  Decreased amb distance this session due to increased c/o pain more then yesterday.  Pt given HEP and performed all supine TE's.  Educated on freq and use of ICE.  Instructed on proper tech to get in/out car.  Pt ready to D/C to home today.  Follow Up Recommendations  Home health PT     Equipment Recommendations       Recommendations for Other Services       Precautions / Restrictions Precautions Precautions: Fall Restrictions Weight Bearing Restrictions: No    Mobility  Bed Mobility Overal bed mobility: Modified Independent                Transfers Overall transfer level: Modified independent                  Ambulation/Gait Ambulation/Gait assistance: Supervision Ambulation Distance (Feet): 75 Feet Assistive device: 4-wheeled walker Gait Pattern/deviations: Step-to pattern Gait velocity: decr.    General Gait Details: decreased amb distance due to increased c/o pain today vs yesterday.   Stairs            Wheelchair Mobility    Modified Rankin (Stroke Patients Only)       Balance                                    Cognition                            Exercises   Total Hip Replacement TE's 10 reps ankle pumps 10 reps knee presses 10 reps heel slides 10 reps SAQ's 10 reps ABD Followed by ICE     General Comments        Pertinent Vitals/Pain C/o "alot soreness" "more than yesterday" Pre medicated ICE applied    Home Living                      Prior Function            PT Goals (current goals can now be found in the care plan section) Progress towards PT goals: Progressing toward goals    Frequency       PT Plan       Co-evaluation             End of Session Equipment Utilized During Treatment: Gait belt Activity Tolerance: Patient tolerated treatment well Patient left: in bed;with call bell/phone within reach     Time: 0940-1025 PT Time Calculation (min): 45 min  Charges:  $Gait Training: 8-22 mins $Therapeutic Exercise: 8-22 mins $Therapeutic Activity: 8-22 mins                    G Codes:      Felecia ShellingLori Cuthbert Turton  PTA [facility]  Acute  Rehab Pager      [phone]

## 2014-02-01 NOTE — Discharge Summary (Signed)
Physician Discharge Summary  Patient ID: Megan Robles MRN: [id number] DOB/AGE: 41/09/1970 40 y.o.  Admit date: 01/26/2014 Discharge date: 01/29/2014   Procedures:  Procedure(s) (LRB): LEFT TOTAL HIP ARTHROPLASTY ANTERIOR APPROACH (Left)  Attending Physician:  Dr. Durene RomansMatthew Olin   Admission Diagnoses:   Left hip OA / pain  Discharge Diagnoses:  Active Problems:   S/P total hip arthroplasty   Expected blood loss anemia   Morbid obesity  Past Medical History  Diagnosis Date  . Hypertension   . Anemia   . Arthritis   . Fibromyalgia   . Depression   . History of blood transfusion   . Pneumonia     HPI: Pt is a 43 y.o. female complaining of left hip pain for multiple years. Pain had continually increased since the beginning. X-rays in the clinic show end-stage arthritic changes of the left hip. Pt has tried various conservative treatments which have failed to alleviate their symptoms, including injections and therapy. Various options are discussed with the patient. Risks, benefits and expectations were discussed with the patient. Patient understand the risks, benefits and expectations and wishes to proceed with surgery.   PCP: [name], GILLIAN W, NP   Discharged Condition: good  Hospital Course:  Patient underwent the above stated procedure on 01/26/2014. Patient tolerated the procedure well and brought to the recovery room in good condition and subsequently to the floor.  POD #1 BP: 165/61 ; Pulse: 100 ; Temp: 97.8 F (36.6 C) ; Resp: 20  Patient reports pain as moderate, pain controlled. No events throughout the night. Neurovascular intact, dorsiflexion/plantar flexion intact, incision: dressing C/D/I, no cellulitis present and compartment soft.   LABS  Basename    HGB  7.7  HCT  25.3   POD #2  BP: 135/96 ; Pulse: 99 ; Temp: 99.3 F (37.4 C) ; Resp: 18  Patient reports pain as mild, pain controlled. No events throughout the night. States that she will consider returning  home after discharge from hospital. She states that she is feeling dizzy this morning, which she states that she has known anemia issue when she has her menses which started today. Her H&H has already been low post-op. Neurovascular intact, dorsiflexion/plantar flexion intact, incision: dressing C/D/I, no cellulitis present and compartment soft.   LABS  Basename    HGB  7.2  HCT  23.5   POD #3  BP: 120/79 ; Pulse: 100 ; Temp: 98.5 F (36.9 C) ; Resp: 18  Patient reports pain as mild, pain controlled. No events throughout the night. Feels better after receiving 2 units of blood yesterday. Ready to be discharged home. Neurovascular intact, dorsiflexion/plantar flexion intact, incision: dressing C/D/I, no cellulitis present and compartment soft.   LABS  Basename    HGB  8.2  HCT  25.4    Discharge Exam: General appearance: alert, cooperative and no distress Extremities: Homans sign is negative, no sign of DVT, no edema, redness or tenderness in the calves or thighs and no ulcers, gangrene or trophic changes  Disposition: Home with follow up in 2 weeks   Follow-up Information   Follow up with Shelda PalLIN,Helix Lafontaine D, MD. Schedule an appointment as soon as possible for a visit in 2 weeks.   Specialty:  Orthopedic Surgery   Contact information:   [address] [place] [id number] [phone]       Discharge Orders   Future Orders Complete By Expires   Call MD / Call 911  As directed  Scheduling Instructions:   If you experience chest pain or shortness of breath, CALL 911 and be transported to the hospital emergency room.  If you develope a fever above 101 F, pus (white drainage) or increased drainage or redness at the wound, or calf pain, call your surgeon's office.   Change dressing  As directed    Scheduling Instructions:   Maintain surgical dressing for 10-14 days, or until follow up in the clinic.   Constipation Prevention  As directed    Scheduling  Instructions:   Drink plenty of fluids.  Prune juice may be helpful.  You may use a stool softener, such as Colace (over the counter) 100 mg twice a day.  Use MiraLax (over the counter) for constipation as needed.   Diet - low sodium heart healthy  As directed    Discharge instructions  As directed    Scheduling Instructions:   Maintain surgical dressing for 10-14 days, or until follow up in the clinic. [facility]. Call with any questions or concerns.   Increase activity slowly as tolerated  As directed    TED hose  As directed    Scheduling Instructions:   Use stockings (TED hose) for 2 weeks on both leg(s).  You may remove them at night for sleeping.   Weight bearing as tolerated  As directed    Questions:     Laterality:  left   Extremity:  Lower        Medication List    STOP taking these medications       diclofenac sodium 1 % Gel  Commonly known as:  VOLTAREN     ferrous sulfate 325 (65 FE) MG EC tablet  Replaced by:  ferrous sulfate 325 (65 FE) MG tablet     indomethacin 25 MG capsule  Commonly known as:  INDOCIN     traMADol 50 MG tablet  Commonly known as:  ULTRAM      TAKE these medications       aspirin 325 MG EC tablet  Take 1 tablet (325 mg total) by mouth 2 (two) times daily.     DSS 100 MG Caps  Take 100 mg by mouth 2 (two) times daily.     DULoxetine 60 MG capsule  Commonly known as:  CYMBALTA  Take 60 mg by mouth at bedtime.     ferrous sulfate 325 (65 FE) MG tablet  Take 1 tablet (325 mg total) by mouth 3 (three) times daily after meals.     hydrochlorothiazide 12.5 MG capsule  Commonly known as:  MICROZIDE  Take 25 mg by mouth at bedtime.     HYDROcodone-acetaminophen 7.5-325 MG per tablet  Commonly known as:  NORCO  Take 1-2 tablets by mouth every 4 (four) hours as needed for moderate pain.     methocarbamol 500 MG tablet  Commonly known as:  ROBAXIN  Take 1 tablet (500 mg total) by mouth every 6  (six) hours as needed for muscle spasms.     polyethylene glycol packet  Commonly known as:  MIRALAX / GLYCOLAX  Take 17 g by mouth daily as needed for mild constipation.         Signed: Anastasio AuerbachMatthew S. Yolanda Huffstetler   PAC  02/01/2014, 11:09 AM

## 2014-02-01 NOTE — Progress Notes (Signed)
   Subjective: 3 Days Post-Op Procedure(s) (LRB): LEFT TOTAL HIP ARTHROPLASTY ANTERIOR APPROACH (Left)   Patient reports pain as mild, pain controlled. No events throughout the night. Feels better after receiving 2 units of blood yesterday.  Ready to be discharged home.  Objective:   VITALS:   Filed Vitals:   01/29/14 0704  BP: 120/79  Pulse: 100  Temp: 98.5 F (36.9 C)  Resp: 18    Neurovascular intact Dorsiflexion/Plantar flexion intact Incision: dressing C/D/I No cellulitis present Compartment soft   LABS   Recent Labs   01/27/14 0525  01/28/14 0437 01/29/14 0437   HGB  7.7*  7.2*  8.2*   HCT  25.3*  23.5* 25.4 *   WBC  9.0  10.0 8.5   PLT  338  339 319     Recent Labs   01/27/14 0525  01/28/14 0437   NA  137  136*   K  3.7  3.6*   BUN  6  7  CREATININE  0.67  0.70  GLUCOSE  117*  117*        Assessment/Plan: 3 Days Post-Op Procedure(s) (LRB): LEFT TOTAL HIP ARTHROPLASTY ANTERIOR APPROACH (Left) Up with therapy Discharge home with home health [facility]. Follow up with OLIN,Mattias Walmsley D in 2 weeks.  Contact information:  [facility] [address] [place] [id number] [phone]       Anastasio AuerbachMatthew S. Maxie Slovacek   PAC  02/01/2014, 11:11 AM

## 2014-03-19 DIAGNOSIS — J069 Acute upper respiratory infection, unspecified: Secondary | ICD-10-CM

## 2014-03-19 HISTORY — DX: Acute upper respiratory infection, unspecified: J06.9

## 2014-03-31 ENCOUNTER — Encounter (HOSPITAL_COMMUNITY): Payer: Self-pay | Admitting: Pharmacy Technician

## 2014-04-07 NOTE — H&P (Signed)
TOTAL KNEE ADMISSION H&P  Patient is being admitted for right total knee arthroplasty.  Subjective:  Chief Complaint:      Right knee OA / pain.  HPI: Megan Robles, 43 y.o. female, has a history of pain and functional disability in the right knee due to arthritis and has failed non-surgical conservative treatments for greater than 12 weeks to include NSAID's and/or analgesics, corticosteriod injections, viscosupplementation injections, use of assistive devices and activity modification.  Onset of symptoms was gradual, starting >10 years ago with gradually worsening course since that time. The patient noted no past surgery on the right knee(s).  Patient currently rates pain in the right knee(s) at 8 out of 10 with activity. Patient has night pain, worsening of pain with activity and weight bearing, pain that interferes with activities of daily living, pain with passive range of motion, crepitus and joint swelling.  Patient has evidence of periarticular osteophytes and joint space narrowing by imaging studies. There is no active infection.  Risks, benefits and expectations were discussed with the patient.  Risks including but not limited to the risk of anesthesia, blood clots, nerve damage, blood vessel damage, failure of the prosthesis, infection and up to and including death.  Patient understand the risks, benefits and expectations and wishes to proceed with surgery.   PCP: Joneen Roach, NP  D/C Plans:      Home with HHPT  Post-op Meds:       No Rx given   Tranexamic Acid:      To be given - IV    Decadron:      To be given  FYI:     ASA post-op  Oxycodone post-op    Patient Active Problem List   Diagnosis Date Noted  . Expected blood loss anemia 01/28/2014  . Morbid obesity 01/28/2014  . S/P total hip arthroplasty 01/26/2014   Past Medical History  Diagnosis Date  . Hypertension   . Anemia   . Arthritis   . Fibromyalgia   . Depression   . History of blood transfusion   .  Pneumonia     Past Surgical History  Procedure Laterality Date  . Cholecystectomy  2004  . Dilation and curettage of uterus  2004  . Esophagogastroduodenoscopy  02/15/2012    Procedure: ESOPHAGOGASTRODUODENOSCOPY (EGD);  Surgeon: Theda Belfast, MD;  Location: [facility];  Service: Endoscopy;  Laterality: N/A;  . Colonoscopy  02/15/2012    Procedure: COLONOSCOPY;  Surgeon: Theda Belfast, MD;  Location: [facility];  Service: Endoscopy;  Laterality: N/A;  . Total hip arthroplasty Left 01/26/2014    Procedure: LEFT TOTAL HIP ARTHROPLASTY ANTERIOR APPROACH;  Surgeon: [name], MD;  Location: [facility];  Service: Orthopedics;  Laterality: Left;    No prescriptions prior to admission   Allergies  Allergen Reactions  . Banana     Mouth itchy and swelling  . Kiwi Extract     Mouth itchy and swelling  . Pineapple     Mouth itchy    History  Substance Use Topics  . Smoking status: Never Smoker   . Smokeless tobacco: Never Used  . Alcohol Use: No    No family history on file.   Review of Systems  Constitutional: Negative.   HENT: Negative.   Eyes: Negative.   Respiratory: Positive for cough (lingering from cold).   Cardiovascular: Negative.   Gastrointestinal: Negative.   Genitourinary: Negative.   Musculoskeletal: Positive for joint pain.  Skin: Negative.  Neurological: Negative.   Endo/Heme/Allergies: Negative.   Psychiatric/Behavioral: Positive for depression.    Objective:  Physical Exam  Constitutional: She is oriented to person, place, and time. She appears well-developed and well-nourished.  HENT:  Head: Normocephalic and atraumatic.  Mouth/Throat: Oropharynx is clear and moist.  Eyes: Pupils are equal, round, and reactive to light.  Neck: Neck supple. No JVD present. No tracheal deviation present. No thyromegaly present.  Cardiovascular: Normal rate, regular rhythm and intact distal pulses.   Murmur heard. Respiratory: Effort normal and breath sounds  normal. No respiratory distress. She has no wheezes.  GI: Soft. There is no tenderness. There is no guarding.  Musculoskeletal:       Right knee: She exhibits decreased range of motion, swelling, abnormal alignment (significant valgus) and bony tenderness. She exhibits no deformity, no laceration and no erythema. Tenderness found.  Lymphadenopathy:    She has no cervical adenopathy.  Neurological: She is alert and oriented to person, place, and time.  Skin: Skin is warm and dry.  Psychiatric: She has a normal mood and affect.     Labs:  Estimated body mass index is 48.04 kg/(m^2) as calculated from the following:   Height as of 01/26/14: 5\' 4"  (1.626 m).   Weight as of 01/15/14: 127.007 kg (280 lb).   Imaging Review Plain radiographs demonstrate severe degenerative joint disease of the right knee(s). The overall alignment is significant valgus. The bone quality appears to be good for age and reported activity level.  Assessment/Plan:  End stage arthritis, right knee   The patient history, physical examination, clinical judgment of the provider and imaging studies are consistent with end stage degenerative joint disease of the right knee(s) and total knee arthroplasty is deemed medically necessary. The treatment options including medical management, injection therapy arthroscopy and arthroplasty were discussed at length. The risks and benefits of total knee arthroplasty were presented and reviewed. The risks due to aseptic loosening, infection, stiffness, patella tracking problems, thromboembolic complications and other imponderables were discussed. The patient acknowledged the explanation, agreed to proceed with the plan and consent was signed. Patient is being admitted for inpatient treatment for surgery, pain control, PT, OT, prophylactic antibiotics, VTE prophylaxis, progressive ambulation and ADL's and discharge planning. The patient is planning to be discharged home with home health  services.     Anastasio AuerbachMatthew S. Honestee Revard   PA-C  04/07/2014, 3:10 PM

## 2014-04-08 ENCOUNTER — Other Ambulatory Visit (HOSPITAL_COMMUNITY): Payer: Self-pay | Admitting: Orthopedic Surgery

## 2014-04-08 NOTE — Patient Instructions (Addendum)
Your procedure is scheduled on:  04/13/14  TUESDAY  Report to [facility]-- MAIN ENTRANCE- FOLLOW SIGNS TO SHORT STAY CENTER Short Stay Center at    [phone]   AM.   Call this number if you have problems the morning of surgery: [phone]        Do not eat food  :After Midnight. Monday NIGHT__ MAY HAVE CLEAR LIQUIDS Tuesday MORNING UNTIL 0655AM-- THEN NOTHING BY MOUTH   Take these medicines the morning of surgery with A SIP OF WATER:NO REGULAR MEDICATIONS MAY TAKE HYDROCODONE, ROBAXIN IF NEEDED   .  Contacts, dentures or partial plates, or metal hairpins  can not be worn to surgery. Your family will be responsible for glasses, dentures, hearing aides while you are in surgery  Leave suitcase in the car. After surgery it may be brought to your room.  For patients admitted to the hospital, checkout time is 11:00 AM day of  discharge.         [facility] IS NOT RESPONSIBLE FOR ANY VALUABLES                                                                 [facility] - Preparing for Surgery Before surgery, you can play an important role.  Because skin is not sterile, your skin needs to be as free of germs as possible.  You can reduce the number of germs on your skin by washing with CHG (chlorahexidine gluconate) soap before surgery.  CHG is an antiseptic cleaner which kills germs and bonds with the skin to continue killing germs even after washing. Please DO NOT use if you have an allergy to CHG or antibacterial soaps.  If your skin becomes reddened/irritated stop using the CHG and inform your nurse when you arrive at Short Stay. Do not shave (including legs and underarms) for at least 48 hours prior to the first CHG shower.  You may shave your face/neck. Please follow these instructions carefully:  1.  Shower with CHG Soap the night before surgery and the  morning of Surgery.  2.  If you choose to wash your hair, wash your hair first as usual with your  normal  shampoo.  3.  After you  shampoo, rinse your hair and body thoroughly to remove the  shampoo.                           4.  Use CHG as you would any other liquid soap.  You can apply chg directly  to the skin and wash                       Gently with a scrungie or clean washcloth.  5.  Apply the CHG Soap to your body ONLY FROM THE NECK DOWN.   Do not use on face/ open                           Wound or open sores. Avoid contact with eyes, ears mouth and genitals (private parts).  Wash face,  Genitals (private parts) with your normal soap.             6.  Wash thoroughly, paying special attention to the area where your surgery  will be performed.  7.  Thoroughly rinse your body with warm water from the neck down.  8.  DO NOT shower/wash with your normal soap after using and rinsing off  the CHG Soap.                9.  Pat yourself dry with a clean towel.            10.  Wear clean pajamas.            11.  Place clean sheets on your bed the night of your first shower and do not  sleep with pets. Day of Surgery : Do not apply any lotions/deodorants the morning of surgery.  Please wear clean clothes to the hospital/surgery center.  FAILURE TO FOLLOW THESE INSTRUCTIONS MAY RESULT IN THE CANCELLATION OF YOUR SURGERY PATIENT SIGNATURE_________________________________  NURSE SIGNATURE__________________________________  ________________________________________________________________________  WHAT IS A BLOOD TRANSFUSION? Blood Transfusion Information  A transfusion is the replacement of blood or some of its parts. Blood is made up of multiple cells which provide different functions.  Red blood cells carry oxygen and are used for blood loss replacement.  White blood cells fight against infection.  Platelets control bleeding.  Plasma helps clot blood.  Other blood products are available for specialized needs, such as hemophilia or other clotting disorders. BEFORE THE TRANSFUSION  Who gives  blood for transfusions?   Healthy volunteers who are fully evaluated to make sure their blood is safe. This is blood bank blood. Transfusion therapy is the safest it has ever been in the practice of medicine. Before blood is taken from a donor, a complete history is taken to make sure that person has no history of diseases nor engages in risky social behavior (examples are intravenous drug use or sexual activity with multiple partners). The donor's travel history is screened to minimize risk of transmitting infections, such as malaria. The donated blood is tested for signs of infectious diseases, such as HIV and hepatitis. The blood is then tested to be sure it is compatible with you in order to minimize the chance of a transfusion reaction. If you or a relative donates blood, this is often done in anticipation of surgery and is not appropriate for emergency situations. It takes many days to process the donated blood. RISKS AND COMPLICATIONS Although transfusion therapy is very safe and saves many lives, the main dangers of transfusion include:   Getting an infectious disease.  Developing a transfusion reaction. This is an allergic reaction to something in the blood you were given. Every precaution is taken to prevent this. The decision to have a blood transfusion has been considered carefully by your caregiver before blood is given. Blood is not given unless the benefits outweigh the risks. AFTER THE TRANSFUSION  Right after receiving a blood transfusion, you will usually feel much better and more energetic. This is especially true if your red blood cells have gotten low (anemic). The transfusion raises the level of the red blood cells which carry oxygen, and this usually causes an energy increase.  The nurse administering the transfusion will monitor you carefully for complications. HOME CARE INSTRUCTIONS  No special instructions are needed after a transfusion. You may find your energy is better.  Speak with your caregiver about any  limitations on activity for underlying diseases you may have. SEEK MEDICAL CARE IF:   Your condition is not improving after your transfusion.  You develop redness or irritation at the intravenous (IV) site. SEEK IMMEDIATE MEDICAL CARE IF:  Any of the following symptoms occur over the next 12 hours:  Shaking chills.  You have a temperature by mouth above 102 F (38.9 C), not controlled by medicine.  Chest, back, or muscle pain.  People around you feel you are not acting correctly or are confused.  Shortness of breath or difficulty breathing.  Dizziness and fainting.  You get a rash or develop hives.  You have a decrease in urine output.  Your urine turns a dark color or changes to pink, red, or brown. Any of the following symptoms occur over the next 10 days:  You have a temperature by mouth above 102 F (38.9 C), not controlled by medicine.  Shortness of breath.  Weakness after normal activity.  The white part of the eye turns yellow (jaundice).  You have a decrease in the amount of urine or are urinating less often.  Your urine turns a dark color or changes to pink, red, or brown. Document Released: 09/07/2000 Document Revised: 12/03/2011 Document Reviewed: 04/26/2008 ExitCare Patient Information 2014 [employer] [place].  _______________________________________________________________________  Incentive Spirometer  An incentive spirometer is a tool that can help keep your lungs clear and active. This tool measures how well you are filling your lungs with each breath. Taking long deep breaths may help reverse or decrease the chance of developing breathing (pulmonary) problems (especially infection) following:  A long period of time when you are unable to move or be active. BEFORE THE PROCEDURE   If the spirometer includes an indicator to show your best effort, your nurse or respiratory therapist will set it to a desired goal.  If  possible, sit up straight or lean slightly forward. Try not to slouch.  Hold the incentive spirometer in an upright position. INSTRUCTIONS FOR USE  1. Sit on the edge of your bed if possible, or sit up as far as you can in bed or on a chair. 2. Hold the incentive spirometer in an upright position. 3. Breathe out normally. 4. Place the mouthpiece in your mouth and seal your lips tightly around it. 5. Breathe in slowly and as deeply as possible, raising the piston or the ball toward the top of the column. 6. Hold your breath for 3-5 seconds or for as long as possible. Allow the piston or ball to fall to the bottom of the column. 7. Remove the mouthpiece from your mouth and breathe out normally. 8. Rest for a few seconds and repeat Steps 1 through 7 at least 10 times every 1-2 hours when you are awake. Take your time and take a few normal breaths between deep breaths. 9. The spirometer may include an indicator to show your best effort. Use the indicator as a goal to work toward during each repetition. 10. After each set of 10 deep breaths, practice coughing to be sure your lungs are clear. If you have an incision (the cut made at the time of surgery), support your incision when coughing by placing a pillow or rolled up towels firmly against it. Once you are able to get out of bed, walk around indoors and cough well. You may stop using the incentive spirometer when instructed by your caregiver.  RISKS AND COMPLICATIONS  Take your time so you do not get dizzy or  light-headed.  If you are in pain, you may need to take or ask for pain medication before doing incentive spirometry. It is harder to take a deep breath if you are having pain. AFTER USE  Rest and breathe slowly and easily.  It can be helpful to keep track of a log of your progress. Your caregiver can provide you with a simple table to help with this. If you are using the spirometer at home, follow these instructions: SEEK MEDICAL CARE  IF:   You are having difficultly using the spirometer.  You have trouble using the spirometer as often as instructed.  Your pain medication is not giving enough relief while using the spirometer.  You develop fever of 100.5 F (38.1 C) or higher. SEEK IMMEDIATE MEDICAL CARE IF:   You cough up bloody sputum that had not been present before.  You develop fever of 102 F (38.9 C) or greater.  You develop worsening pain at or near the incision site. MAKE SURE YOU:   Understand these instructions.  Will watch your condition.  Will get help right away if you are not doing well or get worse. Document Released: 01/21/2007 Document Revised: 12/03/2011 Document Reviewed: 03/24/2007 ExitCare Patient Information 2014 [employer]Care, [place].   ________________________________________________________________________    CLEAR LIQUID DIET   Foods Allowed                                                                     Foods Excluded  Coffee and tea, regular and decaf                             liquids that you cannot  Plain Jell-O in any flavor                                             see through such as: Fruit ices (not with fruit pulp)                                     milk, soups, orange juice  Iced Popsicles                                    All solid food Carbonated beverages, regular and diet                                    Cranberry, grape and apple juices Sports drinks like Gatorade Lightly seasoned clear broth or consume(fat free) Sugar, honey syrup  Sample Menu Breakfast                                Lunch  Supper Cranberry juice                    Beef broth                            Chicken broth Jell-O                                     Grape juice                           Apple juice Coffee or tea                        Jell-O                                      Popsicle                                                 Coffee or tea                        Coffee or tea  _____________________________________________________________________

## 2014-04-09 ENCOUNTER — Encounter (HOSPITAL_COMMUNITY)
Admission: RE | Admit: 2014-04-09 | Discharge: 2014-04-09 | Disposition: A | Discharge status: Home / Self Care | Payer: Medicaid Other | Source: Ambulatory Visit | Attending: Orthopedic Surgery | Admitting: Orthopedic Surgery

## 2014-04-09 ENCOUNTER — Encounter (HOSPITAL_COMMUNITY): Payer: Self-pay

## 2014-04-09 DIAGNOSIS — Z01812 Encounter for preprocedural laboratory examination: Secondary | ICD-10-CM | POA: Diagnosis not present

## 2014-04-09 DIAGNOSIS — Z01810 Encounter for preprocedural cardiovascular examination: Secondary | ICD-10-CM | POA: Diagnosis present

## 2014-04-09 HISTORY — DX: Acute upper respiratory infection, unspecified: J06.9

## 2014-04-09 LAB — BASIC METABOLIC PANEL
Anion gap: 10 (ref 5–15)
BUN: 9 mg/dL (ref 6–23)
CO2: 26 meq/L (ref 19–32)
CREATININE: 0.66 mg/dL (ref 0.50–1.10)
Calcium: 9.2 mg/dL (ref 8.4–10.5)
Chloride: 105 mEq/L (ref 96–112)
GFR calc Af Amer: >90 mL/min (ref >90)
GFR calc non Af Amer: >90 mL/min (ref >90)
Glucose, Bld: 110 mg/dL — ABNORMAL HIGH (ref 70–99)
POTASSIUM: 3.8 meq/L (ref 3.7–5.3)
Sodium: 141 mEq/L (ref 137–147)

## 2014-04-09 LAB — URINALYSIS, ROUTINE W REFLEX MICROSCOPIC
Glucose, UA: NEGATIVE mg/dL
Ketones, ur: 40 mg/dL — AB
NITRITE: POSITIVE — AB
PH: 5 (ref 5.0–8.0)
Protein, ur: >300 mg/dL — AB
Specific Gravity, Urine: 1.031 — ABNORMAL HIGH (ref 1.005–1.030)
Urobilinogen, UA: 1 mg/dL (ref 0.0–1.0)

## 2014-04-09 LAB — CBC
HEMATOCRIT: 26.5 % — AB (ref 36.0–46.0)
HEMOGLOBIN: 8.1 g/dL — AB (ref 12.0–15.0)
MCH: 24.3 pg — ABNORMAL LOW (ref 26.0–34.0)
MCHC: 30.6 g/dL (ref 30.0–36.0)
MCV: 79.3 fL (ref 78.0–100.0)
Platelets: 479 10*3/uL — ABNORMAL HIGH (ref 150–400)
RBC: 3.34 MIL/uL — ABNORMAL LOW (ref 3.87–5.11)
RDW: 18.3 % — ABNORMAL HIGH (ref 11.5–15.5)
WBC: 6.3 10*3/uL (ref 4.0–10.5)

## 2014-04-09 LAB — HCG, SERUM, QUALITATIVE: Preg, Serum: NEGATIVE

## 2014-04-09 LAB — URINE MICROSCOPIC-ADD ON

## 2014-04-09 LAB — SURGICAL PCR SCREEN
MRSA, PCR: NEGATIVE
Staphylococcus aureus: NEGATIVE

## 2014-04-09 LAB — PROTIME-INR
INR: 0.97 (ref 0.00–1.49)
Prothrombin Time: 12.9 seconds (ref 11.6–15.2)

## 2014-04-09 LAB — APTT: APTT: 32 s (ref 24–37)

## 2014-04-09 NOTE — Progress Notes (Signed)
Chest 4/15  epic

## 2014-04-09 NOTE — Progress Notes (Signed)
Faxed [facility], urine with micro to dr Charlann Boxerolin via epic with note that urine was collected before patient saw nurse- pt is currently mensturating

## 2014-04-12 MED ORDER — DEXTROSE 5 % IV SOLN
3.0000 g | INTRAVENOUS | Status: AC
  Administered 2014-04-13: 3 g via INTRAVENOUS
  Filled 2014-04-12: qty 3000

## 2014-04-13 ENCOUNTER — Encounter (HOSPITAL_COMMUNITY)
Admission: RE | Disposition: A | Discharge status: Home Health | Payer: Self-pay | Source: Ambulatory Visit | Attending: Orthopedic Surgery

## 2014-04-13 ENCOUNTER — Encounter (HOSPITAL_COMMUNITY): Payer: Medicaid Other | Admitting: Anesthesiology

## 2014-04-13 ENCOUNTER — Inpatient Hospital Stay (HOSPITAL_COMMUNITY)
Admission: RE | Admit: 2014-04-13 | Discharge: 2014-04-16 | DRG: 470 | Disposition: A | Discharge status: Home Health | Payer: Medicaid Other | Source: Ambulatory Visit | Attending: Orthopedic Surgery | Admitting: Orthopedic Surgery

## 2014-04-13 ENCOUNTER — Inpatient Hospital Stay (HOSPITAL_COMMUNITY): Payer: Medicaid Other | Admitting: Anesthesiology

## 2014-04-13 ENCOUNTER — Encounter (HOSPITAL_COMMUNITY): Payer: Self-pay | Admitting: *Deleted

## 2014-04-13 DIAGNOSIS — Z96649 Presence of unspecified artificial hip joint: Secondary | ICD-10-CM | POA: Diagnosis not present

## 2014-04-13 DIAGNOSIS — D62 Acute posthemorrhagic anemia: Secondary | ICD-10-CM | POA: Diagnosis not present

## 2014-04-13 DIAGNOSIS — M6580 Other synovitis and tenosynovitis, unspecified site: Secondary | ICD-10-CM | POA: Diagnosis present

## 2014-04-13 DIAGNOSIS — Z01812 Encounter for preprocedural laboratory examination: Secondary | ICD-10-CM

## 2014-04-13 DIAGNOSIS — M1710 Unilateral primary osteoarthritis, unspecified knee: Secondary | ICD-10-CM | POA: Diagnosis present

## 2014-04-13 DIAGNOSIS — M62838 Other muscle spasm: Secondary | ICD-10-CM | POA: Diagnosis not present

## 2014-04-13 DIAGNOSIS — Z6841 Body Mass Index (BMI) 40.0 and over, adult: Secondary | ICD-10-CM

## 2014-04-13 DIAGNOSIS — F3289 Other specified depressive episodes: Secondary | ICD-10-CM | POA: Diagnosis present

## 2014-04-13 DIAGNOSIS — M898X9 Other specified disorders of bone, unspecified site: Secondary | ICD-10-CM | POA: Diagnosis present

## 2014-04-13 DIAGNOSIS — I10 Essential (primary) hypertension: Secondary | ICD-10-CM | POA: Diagnosis present

## 2014-04-13 DIAGNOSIS — L299 Pruritus, unspecified: Secondary | ICD-10-CM | POA: Diagnosis not present

## 2014-04-13 DIAGNOSIS — D500 Iron deficiency anemia secondary to blood loss (chronic): Secondary | ICD-10-CM | POA: Diagnosis present

## 2014-04-13 DIAGNOSIS — Z96659 Presence of unspecified artificial knee joint: Secondary | ICD-10-CM

## 2014-04-13 DIAGNOSIS — Z01810 Encounter for preprocedural cardiovascular examination: Secondary | ICD-10-CM | POA: Diagnosis not present

## 2014-04-13 DIAGNOSIS — N390 Urinary tract infection, site not specified: Secondary | ICD-10-CM | POA: Diagnosis present

## 2014-04-13 DIAGNOSIS — F329 Major depressive disorder, single episode, unspecified: Secondary | ICD-10-CM | POA: Diagnosis present

## 2014-04-13 DIAGNOSIS — IMO0001 Reserved for inherently not codable concepts without codable children: Secondary | ICD-10-CM | POA: Diagnosis present

## 2014-04-13 DIAGNOSIS — M25469 Effusion, unspecified knee: Secondary | ICD-10-CM | POA: Diagnosis present

## 2014-04-13 DIAGNOSIS — Z91018 Allergy to other foods: Secondary | ICD-10-CM

## 2014-04-13 DIAGNOSIS — Z96651 Presence of right artificial knee joint: Secondary | ICD-10-CM

## 2014-04-13 DIAGNOSIS — M25559 Pain in unspecified hip: Secondary | ICD-10-CM | POA: Diagnosis present

## 2014-04-13 HISTORY — PX: TOTAL KNEE ARTHROPLASTY: SHX125

## 2014-04-13 LAB — TYPE AND SCREEN
ABO/RH(D): O POS
Antibody Screen: NEGATIVE

## 2014-04-13 SURGERY — ARTHROPLASTY, KNEE, TOTAL
Anesthesia: Spinal | Site: Knee | Laterality: Right

## 2014-04-13 MED ORDER — PROPOFOL 10 MG/ML IV BOLUS
INTRAVENOUS | Status: AC
  Filled 2014-04-13: qty 20

## 2014-04-13 MED ORDER — PHENYLEPHRINE HCL 10 MG/ML IJ SOLN
10.0000 mg | INTRAVENOUS | Status: DC | PRN
  Administered 2014-04-13: 25 ug/min via INTRAVENOUS

## 2014-04-13 MED ORDER — KETOROLAC TROMETHAMINE 30 MG/ML IJ SOLN
INTRAMUSCULAR | Status: AC
  Filled 2014-04-13: qty 1

## 2014-04-13 MED ORDER — CELECOXIB 200 MG PO CAPS
200.0000 mg | ORAL_CAPSULE | Freq: Two times a day (BID) | ORAL | Status: DC
  Administered 2014-04-13 – 2014-04-16 (×6): 200 mg via ORAL
  Filled 2014-04-13 (×7): qty 1

## 2014-04-13 MED ORDER — FENTANYL CITRATE 0.05 MG/ML IJ SOLN
INTRAMUSCULAR | Status: DC | PRN
  Administered 2014-04-13: 50 ug via INTRAVENOUS

## 2014-04-13 MED ORDER — ONDANSETRON HCL 4 MG/2ML IJ SOLN
4.0000 mg | Freq: Four times a day (QID) | INTRAMUSCULAR | Status: DC | PRN
  Administered 2014-04-13 – 2014-04-15 (×2): 4 mg via INTRAVENOUS
  Filled 2014-04-13 (×2): qty 2

## 2014-04-13 MED ORDER — DULOXETINE HCL 60 MG PO CPEP
60.0000 mg | ORAL_CAPSULE | Freq: Every day | ORAL | Status: DC
  Administered 2014-04-13 – 2014-04-15 (×3): 60 mg via ORAL
  Filled 2014-04-13 (×4): qty 1

## 2014-04-13 MED ORDER — PHENOL 1.4 % MT LIQD: 1.0000 | OROMUCOSAL | Status: DC | PRN

## 2014-04-13 MED ORDER — ONDANSETRON HCL 4 MG PO TABS
4.0000 mg | ORAL_TABLET | Freq: Four times a day (QID) | ORAL | Status: DC | PRN
  Filled 2014-04-13: qty 1

## 2014-04-13 MED ORDER — POLYETHYLENE GLYCOL 3350 17 G PO PACK
17.0000 g | PACK | Freq: Two times a day (BID) | ORAL | Status: DC
  Administered 2014-04-13 – 2014-04-16 (×6): 17 g via ORAL

## 2014-04-13 MED ORDER — DEXAMETHASONE SODIUM PHOSPHATE 10 MG/ML IJ SOLN
10.0000 mg | Freq: Once | INTRAMUSCULAR | Status: AC
  Administered 2014-04-13: 10 mg via INTRAVENOUS

## 2014-04-13 MED ORDER — METOCLOPRAMIDE HCL 5 MG/ML IJ SOLN
5.0000 mg | Freq: Three times a day (TID) | INTRAMUSCULAR | Status: DC | PRN
  Administered 2014-04-15: 10 mg via INTRAVENOUS
  Filled 2014-04-13: qty 2

## 2014-04-13 MED ORDER — HYDROCHLOROTHIAZIDE 25 MG PO TABS
25.0000 mg | ORAL_TABLET | Freq: Every day | ORAL | Status: DC
  Administered 2014-04-13 – 2014-04-15 (×3): 25 mg via ORAL
  Filled 2014-04-13 (×4): qty 1

## 2014-04-13 MED ORDER — MAGNESIUM CITRATE PO SOLN: 1.0000 | Freq: Once | ORAL | Status: AC | PRN

## 2014-04-13 MED ORDER — LACTATED RINGERS IV SOLN: INTRAVENOUS | Status: DC

## 2014-04-13 MED ORDER — DEXAMETHASONE SODIUM PHOSPHATE 10 MG/ML IJ SOLN
INTRAMUSCULAR | Status: AC
  Filled 2014-04-13: qty 1

## 2014-04-13 MED ORDER — ATROPINE SULFATE 0.4 MG/ML IJ SOLN
INTRAMUSCULAR | Status: AC
  Filled 2014-04-13: qty 2

## 2014-04-13 MED ORDER — SODIUM CHLORIDE 0.9 % IV SOLN
INTRAVENOUS | Status: DC
  Administered 2014-04-13 – 2014-04-14 (×2): via INTRAVENOUS
  Filled 2014-04-13 (×9): qty 1000

## 2014-04-13 MED ORDER — ALUM & MAG HYDROXIDE-SIMETH 200-200-20 MG/5ML PO SUSP: 30.0000 mL | ORAL | Status: DC | PRN

## 2014-04-13 MED ORDER — MIDAZOLAM HCL 2 MG/2ML IJ SOLN
INTRAMUSCULAR | Status: AC
  Filled 2014-04-13: qty 2

## 2014-04-13 MED ORDER — OXYCODONE HCL 5 MG PO TABS
5.0000 mg | ORAL_TABLET | ORAL | Status: DC
  Administered 2014-04-13: 15 mg via ORAL
  Administered 2014-04-13: 10 mg via ORAL
  Administered 2014-04-14 – 2014-04-16 (×16): 15 mg via ORAL
  Filled 2014-04-13: qty 2
  Filled 2014-04-13 (×17): qty 3

## 2014-04-13 MED ORDER — FENTANYL CITRATE 0.05 MG/ML IJ SOLN
INTRAMUSCULAR | Status: AC
  Filled 2014-04-13: qty 2

## 2014-04-13 MED ORDER — SODIUM CHLORIDE 0.9 % IJ SOLN
INTRAMUSCULAR | Status: AC
  Filled 2014-04-13: qty 10

## 2014-04-13 MED ORDER — PROMETHAZINE HCL 25 MG/ML IJ SOLN: 6.2500 mg | INTRAMUSCULAR | Status: DC | PRN

## 2014-04-13 MED ORDER — MENTHOL 3 MG MT LOZG
1.0000 | LOZENGE | OROMUCOSAL | Status: DC | PRN
  Filled 2014-04-13: qty 9

## 2014-04-13 MED ORDER — 0.9 % SODIUM CHLORIDE (POUR BTL) OPTIME
TOPICAL | Status: DC | PRN
  Administered 2014-04-13: 1000 mL

## 2014-04-13 MED ORDER — PHENYLEPHRINE HCL 10 MG/ML IJ SOLN
INTRAMUSCULAR | Status: AC
  Filled 2014-04-13: qty 1

## 2014-04-13 MED ORDER — ASPIRIN EC 325 MG PO TBEC
325.0000 mg | DELAYED_RELEASE_TABLET | Freq: Two times a day (BID) | ORAL | Status: DC
  Administered 2014-04-14 – 2014-04-16 (×5): 325 mg via ORAL
  Filled 2014-04-13 (×7): qty 1

## 2014-04-13 MED ORDER — DOCUSATE SODIUM 100 MG PO CAPS
100.0000 mg | ORAL_CAPSULE | Freq: Two times a day (BID) | ORAL | Status: DC
  Administered 2014-04-13 – 2014-04-16 (×6): 100 mg via ORAL

## 2014-04-13 MED ORDER — HYDROMORPHONE HCL PF 1 MG/ML IJ SOLN
0.5000 mg | INTRAMUSCULAR | Status: DC | PRN
  Administered 2014-04-13: 2 mg via INTRAVENOUS
  Administered 2014-04-13: 1.5 mg via INTRAVENOUS
  Administered 2014-04-13: 1 mg via INTRAVENOUS
  Filled 2014-04-13: qty 2
  Filled 2014-04-13: qty 1
  Filled 2014-04-13: qty 2

## 2014-04-13 MED ORDER — BUPIVACAINE IN DEXTROSE 0.75-8.25 % IT SOLN
INTRATHECAL | Status: DC | PRN
  Administered 2014-04-13: 2 mL via INTRATHECAL

## 2014-04-13 MED ORDER — HYDROMORPHONE HCL PF 1 MG/ML IJ SOLN: 0.2500 mg | INTRAMUSCULAR | Status: DC | PRN

## 2014-04-13 MED ORDER — CEFAZOLIN SODIUM-DEXTROSE 2-3 GM-% IV SOLR
2.0000 g | Freq: Four times a day (QID) | INTRAVENOUS | Status: AC
  Administered 2014-04-13 – 2014-04-14 (×2): 2 g via INTRAVENOUS
  Filled 2014-04-13 (×2): qty 50

## 2014-04-13 MED ORDER — KETOROLAC TROMETHAMINE 30 MG/ML IJ SOLN
INTRAMUSCULAR | Status: DC | PRN
  Administered 2014-04-13: 30 mg via INTRAVENOUS

## 2014-04-13 MED ORDER — MIDAZOLAM HCL 5 MG/5ML IJ SOLN
INTRAMUSCULAR | Status: DC | PRN
  Administered 2014-04-13: 2 mg via INTRAVENOUS

## 2014-04-13 MED ORDER — LACTATED RINGERS IV SOLN
INTRAVENOUS | Status: DC
  Administered 2014-04-13: 1000 mL via INTRAVENOUS

## 2014-04-13 MED ORDER — PROPOFOL INFUSION 10 MG/ML OPTIME
INTRAVENOUS | Status: DC | PRN
  Administered 2014-04-13: 140 ug/kg/min via INTRAVENOUS

## 2014-04-13 MED ORDER — SODIUM CHLORIDE 0.9 % IJ SOLN
INTRAMUSCULAR | Status: DC | PRN
  Administered 2014-04-13: 9 mL via INTRAVENOUS

## 2014-04-13 MED ORDER — BISACODYL 10 MG RE SUPP: 10.0000 mg | Freq: Every day | RECTAL | Status: DC | PRN

## 2014-04-13 MED ORDER — LACTATED RINGERS IV SOLN
INTRAVENOUS | Status: DC | PRN
  Administered 2014-04-13 (×4): via INTRAVENOUS

## 2014-04-13 MED ORDER — TRANEXAMIC ACID 100 MG/ML IV SOLN
1000.0000 mg | Freq: Once | INTRAVENOUS | Status: AC
  Administered 2014-04-13: 1000 mg via INTRAVENOUS
  Filled 2014-04-13: qty 10

## 2014-04-13 MED ORDER — METHOCARBAMOL 500 MG PO TABS
500.0000 mg | ORAL_TABLET | Freq: Four times a day (QID) | ORAL | Status: DC | PRN
  Administered 2014-04-14 – 2014-04-16 (×7): 500 mg via ORAL
  Filled 2014-04-13 (×7): qty 1

## 2014-04-13 MED ORDER — BUPIVACAINE-EPINEPHRINE (PF) 0.25% -1:200000 IJ SOLN
INTRAMUSCULAR | Status: DC | PRN
  Administered 2014-04-13: 30 mL

## 2014-04-13 MED ORDER — DIPHENHYDRAMINE HCL 25 MG PO CAPS
25.0000 mg | ORAL_CAPSULE | Freq: Four times a day (QID) | ORAL | Status: DC | PRN
  Administered 2014-04-13 – 2014-04-16 (×3): 25 mg via ORAL
  Filled 2014-04-13 (×3): qty 1

## 2014-04-13 MED ORDER — PROPOFOL 10 MG/ML IV BOLUS
INTRAVENOUS | Status: DC | PRN
  Administered 2014-04-13 (×2): 30 mg via INTRAVENOUS

## 2014-04-13 MED ORDER — BUPIVACAINE LIPOSOME 1.3 % IJ SUSP
20.0000 mL | Freq: Once | INTRAMUSCULAR | Status: AC
  Administered 2014-04-13: 20 mL
  Filled 2014-04-13: qty 20

## 2014-04-13 MED ORDER — FERROUS SULFATE 325 (65 FE) MG PO TABS
325.0000 mg | ORAL_TABLET | Freq: Three times a day (TID) | ORAL | Status: DC
  Administered 2014-04-14 – 2014-04-16 (×5): 325 mg via ORAL
  Filled 2014-04-13 (×11): qty 1

## 2014-04-13 MED ORDER — EPHEDRINE SULFATE 50 MG/ML IJ SOLN
INTRAMUSCULAR | Status: AC
Start: 2014-04-13 — End: 2014-04-13
  Filled 2014-04-13: qty 1

## 2014-04-13 MED ORDER — METHOCARBAMOL 1000 MG/10ML IJ SOLN
500.0000 mg | Freq: Four times a day (QID) | INTRAVENOUS | Status: DC | PRN
  Administered 2014-04-13: 500 mg via INTRAVENOUS
  Filled 2014-04-13: qty 5

## 2014-04-13 MED ORDER — METOCLOPRAMIDE HCL 10 MG PO TABS: 5.0000 mg | ORAL_TABLET | Freq: Three times a day (TID) | ORAL | Status: DC | PRN

## 2014-04-13 MED ORDER — DEXAMETHASONE SODIUM PHOSPHATE 10 MG/ML IJ SOLN
10.0000 mg | Freq: Once | INTRAMUSCULAR | Status: AC
  Administered 2014-04-14: 10 mg via INTRAVENOUS
  Filled 2014-04-13: qty 1

## 2014-04-13 MED ORDER — SODIUM CHLORIDE 0.9 % IR SOLN
Status: DC | PRN
  Administered 2014-04-13: 1000 mL

## 2014-04-13 MED ORDER — BUPIVACAINE-EPINEPHRINE (PF) 0.25% -1:200000 IJ SOLN
INTRAMUSCULAR | Status: AC
  Filled 2014-04-13: qty 30

## 2014-04-13 SURGICAL SUPPLY — 52 items
BAG ZIPLOCK 12X15 (MISCELLANEOUS) IMPLANT
BANDAGE ELASTIC 6 VELCRO ST LF (GAUZE/BANDAGES/DRESSINGS) ×2 IMPLANT
BANDAGE ESMARK 6X9 LF (GAUZE/BANDAGES/DRESSINGS) ×1 IMPLANT
BLADE SAW SGTL 13.0X1.19X90.0M (BLADE) ×2 IMPLANT
BNDG ESMARK 6X9 LF (GAUZE/BANDAGES/DRESSINGS) ×2
BOWL SMART MIX CTS (DISPOSABLE) ×2 IMPLANT
CAP UPCHARGE REVISION TRAY ×2 IMPLANT
CAPT RP KNEE ×2 IMPLANT
CEMENT HV SMART SET (Cement) ×2 IMPLANT
CUFF TOURN SGL QUICK 44 (TOURNIQUET CUFF) ×2 IMPLANT
DERMABOND ADVANCED (GAUZE/BANDAGES/DRESSINGS) ×1
DERMABOND ADVANCED .7 DNX12 (GAUZE/BANDAGES/DRESSINGS) ×1 IMPLANT
DRAPE EXTREMITY T 121X128X90 (DRAPE) ×2 IMPLANT
DRAPE POUCH INSTRU U-SHP 10X18 (DRAPES) ×2 IMPLANT
DRAPE U-SHAPE 47X51 STRL (DRAPES) ×2 IMPLANT
DRSG AQUACEL AG ADV 3.5X10 (GAUZE/BANDAGES/DRESSINGS) ×2 IMPLANT
DRSG TEGADERM 4X4.75 (GAUZE/BANDAGES/DRESSINGS) IMPLANT
DURAPREP 26ML APPLICATOR (WOUND CARE) ×4 IMPLANT
ELECT REM PT RETURN 9FT ADLT (ELECTROSURGICAL) ×2
ELECTRODE REM PT RTRN 9FT ADLT (ELECTROSURGICAL) ×1 IMPLANT
FACESHIELD WRAPAROUND (MASK) ×10 IMPLANT
GAUZE SPONGE 2X2 8PLY STRL LF (GAUZE/BANDAGES/DRESSINGS) IMPLANT
GLOVE BIOGEL PI IND STRL 7.5 (GLOVE) ×1 IMPLANT
GLOVE BIOGEL PI IND STRL 8 (GLOVE) ×1 IMPLANT
GLOVE BIOGEL PI INDICATOR 7.5 (GLOVE) ×1
GLOVE BIOGEL PI INDICATOR 8 (GLOVE) ×1
GLOVE ECLIPSE 8.0 STRL XLNG CF (GLOVE) ×2 IMPLANT
GLOVE ORTHO TXT STRL SZ7.5 (GLOVE) ×4 IMPLANT
GOWN SPEC L3 XXLG W/TWL (GOWN DISPOSABLE) ×2 IMPLANT
GOWN STRL REUS W/TWL LRG LVL3 (GOWN DISPOSABLE) ×2 IMPLANT
HANDPIECE INTERPULSE COAX TIP (DISPOSABLE) ×1
KIT BASIN OR (CUSTOM PROCEDURE TRAY) ×2 IMPLANT
MANIFOLD NEPTUNE II (INSTRUMENTS) ×2 IMPLANT
NDL SAFETY ECLIPSE 18X1.5 (NEEDLE) ×1 IMPLANT
NEEDLE HYPO 18GX1.5 SHARP (NEEDLE) ×1
PACK TOTAL JOINT (CUSTOM PROCEDURE TRAY) ×2 IMPLANT
POSITIONER SURGICAL ARM (MISCELLANEOUS) ×2 IMPLANT
SET HNDPC FAN SPRY TIP SCT (DISPOSABLE) ×1 IMPLANT
SET PAD KNEE POSITIONER (MISCELLANEOUS) ×2 IMPLANT
SPONGE GAUZE 2X2 STER 10/PKG (GAUZE/BANDAGES/DRESSINGS)
SUCTION FRAZIER 12FR DISP (SUCTIONS) ×2 IMPLANT
SUT MNCRL AB 4-0 PS2 18 (SUTURE) ×2 IMPLANT
SUT VIC AB 1 CT1 36 (SUTURE) ×2 IMPLANT
SUT VIC AB 2-0 CT1 27 (SUTURE) ×3
SUT VIC AB 2-0 CT1 TAPERPNT 27 (SUTURE) ×3 IMPLANT
SUT VLOC 180 0 24IN GS25 (SUTURE) ×2 IMPLANT
SYRINGE 60CC LL (MISCELLANEOUS) ×2 IMPLANT
TOWEL OR 17X26 10 PK STRL BLUE (TOWEL DISPOSABLE) ×2 IMPLANT
TOWEL OR NON WOVEN STRL DISP B (DISPOSABLE) IMPLANT
TRAY FOLEY CATH 14FRSI W/METER (CATHETERS) ×2 IMPLANT
WATER STERILE IRR 1500ML POUR (IV SOLUTION) ×2 IMPLANT
WRAP KNEE MAXI GEL POST OP (GAUZE/BANDAGES/DRESSINGS) ×2 IMPLANT

## 2014-04-13 NOTE — Anesthesia Preprocedure Evaluation (Signed)
Anesthesia Evaluation  Patient identified by MRN, date of birth, ID band Patient awake    Reviewed: Allergy & Precautions, H&P , NPO status , Patient's Chart, lab work & pertinent test results  Airway Mallampati: II TM Distance: >3 FB Neck ROM: Full    Dental no notable dental hx.    Pulmonary pneumonia -, resolved,  breath sounds clear to auscultation  Pulmonary exam normal       Cardiovascular hypertension, Pt. on medications Rhythm:Regular Rate:Normal     Neuro/Psych PSYCHIATRIC DISORDERS Depression  Neuromuscular disease    GI/Hepatic negative GI ROS, Neg liver ROS,   Endo/Other  Morbid obesity  Renal/GU negative Renal ROS  negative genitourinary   Musculoskeletal  (+) Fibromyalgia -  Abdominal (+) + obese,   Peds  Hematology  (+) anemia ,   Anesthesia Other Findings   Reproductive/Obstetrics negative OB ROS                           Anesthesia Physical Anesthesia Plan  ASA: III  Anesthesia Plan:    Post-op Pain Management:    Induction:   Airway Management Planned:   Additional Equipment:   Intra-op Plan:   Post-operative Plan:   Informed Consent:   Plan Discussed with:   Anesthesia Plan Comments:         Anesthesia Quick Evaluation

## 2014-04-13 NOTE — Op Note (Addendum)
NAME:  Megan Robles                      MEDICAL RECORD NO.:  [id number]                             FACILITY:  [facility]      PHYSICIAN:  Madlyn Frankel. Charlann Boxer, M.D.  DATE OF BIRTH:  30-Jun-1971      DATE OF PROCEDURE:  04/13/2014                                     OPERATIVE REPORT         PREOPERATIVE DIAGNOSIS:  Right knee osteoarthritis.      POSTOPERATIVE DIAGNOSIS:  Right knee osteoarthritis.      FINDINGS:  The patient was noted to have complete loss of cartilage and   bone-on-bone arthritis with associated osteophytes in the lateral and patellofemoral compartments of   the knee with a significant synovitis and associated effusion.      PROCEDURE:  Right total knee replacement.      COMPONENTS USED:  DePuy rotating platform posterior stabilized knee   system, a size 2.5 femur, tibia, 15 mm PS insert, and 35 patellar   button.      SURGEON:  Madlyn Frankel. Charlann Boxer, M.D.      ASSISTANT:  Lanney Gins, PA-C.      ANESTHESIA:  Spinal.      SPECIMENS:  None.      COMPLICATION:  None.      DRAINS:  None.  EBL: <75cc      TOURNIQUET TIME:   Total Tourniquet Time Documented: Thigh (Right) - 52 minutes Total: Thigh (Right) - 52 minutes     The patient was stable to the recovery room.      INDICATION FOR PROCEDURE:  Megan Robles is a 43 y.o. female patient of   mine.  The patient had been seen, evaluated, and treated conservatively in the   office with medication, activity modification, and injections.  The patient had   radiographic changes of bone-on-bone arthritis with endplate sclerosis and osteophytes noted.      The patient failed conservative measures including medication, injections, and activity modification, and at this point was ready for more definitive measures.   Based on the radiographic changes and failed conservative measures, the patient   decided to proceed with total knee replacement.  Risks of infection,   DVT, component failure, need for revision  surgery, postop course, and   expectations were all   discussed and reviewed.  Consent was obtained for benefit of pain   relief.      PROCEDURE IN DETAIL:  The patient was brought to the operative theater.   Once adequate anesthesia, preoperative antibiotics, 2 gm of Ancef administered, the patient was positioned supine with the right thigh tourniquet placed.  The  right lower extremity was prepped and draped in sterile fashion.  A time-   out was performed identifying the patient, planned procedure, and   extremity.      The right lower extremity was placed in the [facility] leg holder.  The leg was   exsanguinated, tourniquet elevated to 250 mmHg.  A midline incision was   made followed by median parapatellar arthrotomy.  Following initial   exposure, attention was first directed to the  patella.  Precut   measurement was noted to be 23 mm.  I resected down to 14 mm and used a   35 patellar button to restore patellar height as well as cover the cut   surface.      The lug holes were drilled and a metal shim was placed to protect the   patella from retractors and saw blades.      At this point, attention was now directed to the femur.  The femoral   canal was opened with a drill, irrigated to try to prevent fat emboli.  An   intramedullary rod was passed at 5 degrees valgus, 12 mm of bone was   resected off the distal femur.  Following this resection, the tibia was   subluxated anteriorly.  Using the extramedullary guide, 2 mm of bone was resected off   the proximal lateral tibia, the most effected side.  We confirmed the gap would be   stable medially and laterally with the10 mm insert initially as well as confirmed   the cut was perpendicular in the coronal plane, checking with an alignment rod.      Once this was done, I sized the femur to be a size 2.5 in the anterior-   posterior dimension, chose a standard component based on medial and   lateral dimension.  The size 2.5 rotation  block was then pinned in   position anterior referenced using the C-clamp to set rotation.  The   anterior, posterior, and  chamfer cuts were made without difficulty nor   notching making certain that I was along the anterior cortex to help   with flexion gap stability.      The final box cut was made off the lateral aspect of distal femur.      At this point, the tibia was sized to be a size 3, the size 3 tray for MBT revision was   then pinned in position through the medial third of the tubercle,   drilled, and keel punched.  Trial reduction was now carried with a 2.5 femur,  3 MBT trial tibial tray , a 12.5 then 15 mm insert, and the 35 patella botton.  The knee was brought to   extension, full extension with good flexion stability with the patella   tracking through the trochlea without application of pressure.  Given   all these findings, the trial components removed.  Final components were   opened and cement was mixed.  The knee was irrigated with normal saline   solution and pulse lavage.  The synovial lining was   then injected with 20cc of Exparel, 30cc of 0.25% Marcaine with epinephrine and 1 cc of Toradol,   total of 61 cc.      The knee was irrigated.  Final implants were then cemented onto clean and   dried cut surfaces of bone with the knee brought to extension with a 15 mm trial insert.      Once the cement had fully cured, the excess cement was removed   throughout the knee.  I confirmed I was satisfied with the range of   motion and stability, and the final 15 mm PS insert was chosen.  It was   placed into the knee.      The tourniquet had been let down at 52 minutes.  No significant   hemostasis required.  The   extensor mechanism was then reapproximated using #1 Vicryl and #0 V-lock with  the knee   in flexion.  The   remaining wound was closed with 2-0 Vicryl and running 4-0 Monocryl.   The knee was cleaned, dried, dressed sterilely using Dermabond and   Aquacel  dressing.  The patient was then   brought to recovery room in stable condition, tolerating the procedure   well.   Please note that Physician Assistant, Lanney GinsMatthew Babish, was present for the entirety of the case, and was utilized for pre-operative positioning, peri-operative retractor management, general facilitation of the procedure.  He was also utilized for primary wound closure at the end of the case.              Madlyn FrankelMatthew D. Charlann Boxerlin, M.D.    04/13/2014 2:45 PM

## 2014-04-13 NOTE — Transfer of Care (Signed)
Immediate Anesthesia Transfer of Care Note  Patient: Megan Robles  Procedure(s) Performed: Procedure(s): RIGHT TOTAL KNEE ARTHROPLASTY (Right)  Patient Location: PACU  Anesthesia Type:Spinal  Level of Consciousness: awake, alert , oriented and patient cooperative  Airway & Oxygen Therapy: Patient Spontanous Breathing and Patient connected to face mask oxygen  Post-op Assessment: Report given to PACU RN and Post -op Vital signs reviewed and stable  Post vital signs: Reviewed and stable  Complications: No apparent anesthesia complications

## 2014-04-13 NOTE — Interval H&P Note (Signed)
History and Physical Interval Note:  04/13/2014 11:28 AM  Megan Robles  has presented today for surgery, with the diagnosis of Right knee osteoarthritis  The various methods of treatment have been discussed with the patient and family. After consideration of risks, benefits and other options for treatment, the patient has consented to  Procedure(s): RIGHT TOTAL KNEE ARTHROPLASTY (Right) as a surgical intervention .  The patient's history has been reviewed, patient examined, no change in status, stable for surgery.  I have reviewed the patient's chart and labs.  Questions were answered to the patient's satisfaction.     Shelda PalLIN,Channie Bostick D

## 2014-04-13 NOTE — Anesthesia Procedure Notes (Signed)
Spinal  Patient location during procedure: OR Start time: 04/13/2014 12:45 PM End time: 04/13/2014 12:54 PM Staffing Anesthesiologist: Freddie Apley F Performed by: anesthesiologist  Preanesthetic Checklist Completed: patient identified, site marked, surgical consent, pre-op evaluation, timeout performed, IV checked, risks and benefits discussed and monitors and equipment checked Spinal Block Patient position: sitting Prep: Betadine Patient monitoring: heart rate, continuous pulse ox and blood pressure Approach: midline Location: L3-4 Injection technique: single-shot Needle Needle type: Spinocan and Whitacre  Needle gauge: 25 G Needle length: 12.7 cm Additional Notes Expiration date of kit checked and confirmed. Patient tolerated procedure well, without complications. Negative heme/paresthesia

## 2014-04-14 LAB — BASIC METABOLIC PANEL
Anion gap: 11 (ref 5–15)
BUN: 9 mg/dL (ref 6–23)
CALCIUM: 9.2 mg/dL (ref 8.4–10.5)
CO2: 25 mEq/L (ref 19–32)
Chloride: 102 mEq/L (ref 96–112)
Creatinine, Ser: 0.59 mg/dL (ref 0.50–1.10)
GFR calc Af Amer: >90 mL/min (ref >90)
GLUCOSE: 124 mg/dL — AB (ref 70–99)
Potassium: 4 mEq/L (ref 3.7–5.3)
Sodium: 138 mEq/L (ref 137–147)

## 2014-04-14 LAB — CBC
HEMATOCRIT: 26.6 % — AB (ref 36.0–46.0)
HEMOGLOBIN: 7.9 g/dL — AB (ref 12.0–15.0)
MCH: 23.7 pg — AB (ref 26.0–34.0)
MCHC: 29.7 g/dL — ABNORMAL LOW (ref 30.0–36.0)
MCV: 79.6 fL (ref 78.0–100.0)
Platelets: 488 10*3/uL — ABNORMAL HIGH (ref 150–400)
RBC: 3.34 MIL/uL — ABNORMAL LOW (ref 3.87–5.11)
RDW: 17.9 % — ABNORMAL HIGH (ref 11.5–15.5)
WBC: 13.7 10*3/uL — ABNORMAL HIGH (ref 4.0–10.5)

## 2014-04-14 MED ORDER — HYDROXYZINE HCL 25 MG PO TABS
25.0000 mg | ORAL_TABLET | Freq: Four times a day (QID) | ORAL | Status: DC | PRN
  Administered 2014-04-14 – 2014-04-16 (×6): 25 mg via ORAL
  Filled 2014-04-14 (×4): qty 1

## 2014-04-14 NOTE — Evaluation (Signed)
Physical Therapy Evaluation Patient Details Name: Hewitt Bladeara L Diallo MRN: [id number] DOB: 07/19/1971 Today's Date: 04/14/2014   History of Present Illness  s/p L Direct Anterior  THA  Clinical Impression  Pt tolerated ambulation. Plans Dc to home. Pt will benefit from PT to address problems listed in note below.     Follow Up Recommendations Home health PT;Supervision/Assistance - 24 hour    Equipment Recommendations  None recommended by PT    Recommendations for Other Services       Precautions / Restrictions Precautions Precautions: Knee      Mobility  Bed Mobility Overal bed mobility: Needs Assistance Bed Mobility: Supine to Sit     Supine to sit: Min assist     General bed mobility comments: support of R leg to lower to floor.  Transfers Overall transfer level: Needs assistance Equipment used: Rolling walker (2 wheeled) Transfers: Sit to/from Stand Sit to Stand: Min assist         General transfer comment: cues for hand placement  Ambulation/Gait Ambulation/Gait assistance: Min assist Ambulation Distance (Feet): 80 Feet Assistive device: Rolling walker (2 wheeled) Gait Pattern/deviations: Step-to pattern;Antalgic     General Gait Details: cues for sequence  Stairs            Wheelchair Mobility    Modified Rankin (Stroke Patients Only)       Balance                                             Pertinent Vitals/Pain Pain 7, meds due soon.    Home Living Family/patient expects to be discharged to:: Private residence Living Arrangements: Children Available Help at Discharge: Family Type of Home: Apartment Home Access: Level entry     Home Layout: One level Home Equipment: Environmental consultantWalker - 4 wheels Additional Comments: plans snf; has a standard commode and tub at home    Prior Function Level of Independence: Independent with assistive device(s)               Hand Dominance        Extremity/Trunk Assessment   Upper Extremity Assessment: Overall WFL for tasks assessed           Lower Extremity Assessment: RLE deficits/detail RLE Deficits / Details: asssit for SLR       Communication   Communication: No difficulties  Cognition Arousal/Alertness: Awake/alert Behavior During Therapy: WFL for tasks assessed/performed Overall Cognitive Status: Within Functional Limits for tasks assessed                      General Comments      Exercises        Assessment/Plan    PT Assessment Patient needs continued PT services  PT Diagnosis Difficulty walking;Acute pain   PT Problem List Decreased strength;Decreased range of motion;Decreased activity tolerance;Decreased mobility;Decreased knowledge of precautions;Decreased safety awareness;Decreased knowledge of use of DME;Pain  PT Treatment Interventions DME instruction;Gait training;Functional mobility training;Therapeutic activities;Therapeutic exercise;Patient/family education   PT Goals (Current goals can be found in the Care Plan section) Acute Rehab PT Goals Patient Stated Goal: to go home PT Goal Formulation: With patient Time For Goal Achievement: 04/17/14 Potential to Achieve Goals: Good    Frequency 7X/week   Barriers to discharge        Co-evaluation  End of Session   Activity Tolerance: Patient tolerated treatment well Patient left: in chair;with call bell/phone within reach Nurse Communication: Mobility status         Time: 0914-0950 PT Time Calculation (min): 36 min   Charges:   PT Evaluation $Initial PT Evaluation Tier I: 1 Procedure PT Treatments $Gait Training: 23-37 mins   PT G Codes:          Rada Hay 04/14/2014, 4:38 PM

## 2014-04-14 NOTE — Progress Notes (Addendum)
   Subjective: 1 Day Post-Op Procedure(s) (LRB): RIGHT TOTAL KNEE ARTHROPLASTY (Right)   Patient reports pain as mild, 3 on a 10 scale.  Muscle spasms increase the pain significantly from time to time. Complaining of itching, believes it is from some of the medication. States she didn't have a good night. No events otherwise.  Objective:   VITALS:   Filed Vitals:   04/14/14 0645  BP: 146/83  Pulse: 76  Temp: 97.6 F (36.4 C)  Resp: 19    Dorsiflexion/Plantar flexion intact Incision: dressing C/D/I No cellulitis present Compartment soft  LABS  Recent Labs  04/14/14 0440  HGB 7.9*  HCT 26.6*  WBC 13.7*  PLT 488*     Recent Labs  04/14/14 0440  NA 138  K 4.0  BUN 9  CREATININE 0.59  GLUCOSE 124*     Assessment/Plan: 1 Day Post-Op Procedure(s) (LRB): RIGHT TOTAL KNEE ARTHROPLASTY (Right) Added hydroxyzine to help with the itching Foley cath d/c'ed Advance diet Up with therapy D/C IV fluids Discharge home with home health eventually, when ready  Expected ABLA  Treated with iron and will observe  Morbid Obesity (BMI >40)  Estimated body mass index is 49.41 kg/(m^2) as calculated from the following:   Height as of this encounter: 5\' 4"  (1.626 m).   Weight as of this encounter: 130.636 kg (288 lb). Patient also counseled that weight may inhibit the healing process Patient counseled that losing weight will help with future health issues       Anastasio AuerbachMatthew S. Ezella Kell   PAC  04/14/2014, 9:08 AM

## 2014-04-14 NOTE — Anesthesia Postprocedure Evaluation (Signed)
Anesthesia Post Note  Patient: Megan Robles  Procedure(s) Performed: Procedure(s) (LRB): RIGHT TOTAL KNEE ARTHROPLASTY (Right)  Anesthesia type: Spinal  Patient location: PACU  Post pain: Pain level controlled  Post assessment: Post-op Vital signs reviewed  Last Vitals:  Filed Vitals:   04/14/14 0645  BP: 146/83  Pulse: 76  Temp: 36.4 C  Resp: 19    Post vital signs: Reviewed  Level of consciousness: sedated  Complications: No apparent anesthesia complications

## 2014-04-14 NOTE — Progress Notes (Signed)
Physical Therapy Treatment Patient Details Name: Megan Robles Diallo MRN: [id number] DOB: 06/12/1971 Today's Date: 04/14/2014    History of Present Illness s/p Robles Direct Anterior  THA    PT Comments    Tolerated exercises  Follow Up Recommendations  Home health PT;Supervision/Assistance - 24 hour     Equipment Recommendations  None recommended by PT    Recommendations for Other Services       Precautions / Restrictions Precautions Precautions: Knee    Mobility            Stairs            Wheelchair Mobility    Modified Rankin (Stroke Patients Only)       Balance                                    Cognition Arousal/Alertness: Awake/alert Behavior During Therapy: WFL for tasks assessed/performed Overall Cognitive Status: Within Functional Limits for tasks assessed                      Exercises Total Joint Exercises Ankle Circles/Pumps: AROM;Both;10 reps;Supine Quad Sets: AROM;Both;10 reps;Supine Heel Slides: AAROM;Right;10 reps;Supine Hip ABduction/ADduction: AROM;Right;10 reps;Supine Straight Leg Raises: AAROM;Right;10 reps;Supine    General Comments        Pertinent Vitals/Pain R knee 6 with SLR    Home Living Family/patient expects to be discharged to:: Private residence Living Arrangements: Children Available Help at Discharge: Family Type of Home: Apartment Home Access: Level entry   Home Layout: One level Home Equipment: Environmental consultantWalker - 4 wheels Additional Comments: plans snf; has a standard commode and tub at home    Prior Function Level of Independence: Independent with assistive device(s)          PT Goals (current goals can now be found in the care plan section) Acute Rehab PT Goals Patient Stated Goal: to go home PT Goal Formulation: With patient Time For Goal Achievement: 04/17/14 Potential to Achieve Goals: Good Progress towards PT goals: Progressing toward goals    Frequency  7X/week    PT Plan  Current plan remains appropriate    Co-evaluation             End of Session   Activity Tolerance: Patient tolerated treatment well Patient left: with call bell/phone within reach;in bed     Time: 1448-1520 PT Time Calculation (min): 32 min  Charges:  $Gait Training: 23-37 mins $Therapeutic Exercise: 23-37 mins                    G Codes:      Rada HayHill, Deidra Spease Elizabeth 04/14/2014, 4:42 PM

## 2014-04-14 NOTE — Progress Notes (Signed)
CARE MANAGEMENT NOTE 04/14/2014  Patient:  Megan Robles,Megan Robles   Account Number:  [id number]  Date Initiated:  04/14/2014  Documentation initiated by:  Genea Rheaume  Subjective/Objective Assessment:   total knee replacement     Action/Plan:   home with hhc and dme   Anticipated DC Date:  04/17/2014   Anticipated DC Plan:  HOME W HOME HEALTH SERVICES  In-house referral  NA      DC Planning Services  CM consult      [facility]  [facility]   Choice offered to / List presented to:  C-1 Patient   DME arranged  [facility]  [facility] Lavone Nian- [facility]      DME agency  [facility]     HH arranged  HH-2 PT  HH-3 OT      [facility]   Status of service:  In process, will continue to follow Medicare Important Message given?  NA - LOS <3 / Initial given by admissions (If response is "NO", the following Medicare IM given date fields will be blank) Date Medicare IM given:   Medicare IM given by:   Date Additional Medicare IM given:   Additional Medicare IM given by:    Discharge Disposition:    Per UR Regulation:  Reviewed for med. necessity/level of care/duration of stay  If discussed at Long Length of Stay Meetings, dates discussed:    Comments:  [id number]/Shafiq Larch Stark JockDavis, RN, BSN, [place] [phone] Chart Reviewed for discharge and hospital needs. Discharge needs at time of review: None present will follow for needs. Review of patient progress due on [id number]

## 2014-04-14 NOTE — Progress Notes (Signed)
OT Cancellation Note  Patient Details Name: Megan Robles MRN: [id number] DOB: 05/16/1971   Cancelled Treatment:    Reason Eval/Treat Not Completed: Other (comment).  Checked with pt and she would like OT to review ADLs/bathroom transfers. She is working with PT right now.  Will plan to check back in the morning.    Rhianna Raulerson 04/14/2014, 2:59 PM Marica OtterMaryellen Rhyder Koegel, OTR/L [phone] 04/14/2014

## 2014-04-15 LAB — BASIC METABOLIC PANEL
Anion gap: 8 (ref 5–15)
BUN: 9 mg/dL (ref 6–23)
CO2: 29 mEq/L (ref 19–32)
Calcium: 8.9 mg/dL (ref 8.4–10.5)
Chloride: 101 mEq/L (ref 96–112)
Creatinine, Ser: 0.68 mg/dL (ref 0.50–1.10)
GFR calc Af Amer: >90 mL/min (ref >90)
GFR calc non Af Amer: >90 mL/min (ref >90)
Glucose, Bld: 107 mg/dL — ABNORMAL HIGH (ref 70–99)
Potassium: 4 mEq/L (ref 3.7–5.3)
Sodium: 138 mEq/L (ref 137–147)

## 2014-04-15 LAB — CBC
HCT: 24.3 % — ABNORMAL LOW (ref 36.0–46.0)
Hemoglobin: 7.6 g/dL — ABNORMAL LOW (ref 12.0–15.0)
MCH: 24.8 pg — ABNORMAL LOW (ref 26.0–34.0)
MCHC: 31.3 g/dL (ref 30.0–36.0)
MCV: 79.4 fL (ref 78.0–100.0)
Platelets: 441 10*3/uL — ABNORMAL HIGH (ref 150–400)
RBC: 3.06 MIL/uL — ABNORMAL LOW (ref 3.87–5.11)
RDW: 18.2 % — ABNORMAL HIGH (ref 11.5–15.5)
WBC: 12.4 10*3/uL — ABNORMAL HIGH (ref 4.0–10.5)

## 2014-04-15 NOTE — Evaluation (Signed)
Occupational Therapy Evaluation Patient Details Name: Megan Robles MRN: [id number] DOB: 27-Jul-1971 Today's Date: 04/15/2014    History of Present Illness s/p R TKA   Clinical Impression   This 43 year old female was admitted for R TKA.  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  R knee is painful which limits ADLs.  She will benefit from skilled OT to increase safety and independence with adls.  Pt was mod I prior to admission and now she needs max A for LB dressing.  Goals in acute are for supervision to min guard assist.      Follow Up Recommendations  SNF;Supervision/Assistance - 24 hour ; pt's children are not home with her at this time.   Equipment Recommendations  None recommended by OT    Recommendations for Other Services       Precautions / Restrictions Precautions Precautions: Knee Restrictions Weight Bearing Restrictions: No      Mobility Bed Mobility         Supine to sit: Min assist Sit to supine: Min assist   General bed mobility comments: used leg lifter and support of RLE by therapist due to pain  Transfers       Sit to Stand: Min assist              Balance                                            ADL Overall ADL's : Needs assistance/impaired     Grooming: Set up;Sitting   Upper Body Bathing: Set up;Sitting   Lower Body Bathing: Minimal assistance;With adaptive equipment;Sit to/from stand   Upper Body Dressing : Set up;Sitting   Lower Body Dressing: Maximal assistance;With adaptive equipment;Sit to/from stand                 General ADL Comments: Pt had just returned from bathroom:  not tested this session.  She used leg lifter for bed mobility, however, she needed support of OT for leg in addition due to pain.  Cues for safety especially back to bed as pain increased.  She has AE kit from last surgery but can only use for LLE at this time due to not tolerating crossing LLE under R ankle to clear R foot  from floor.       Vision                     Perception     Praxis      Pertinent Vitals/Pain 5/10 n bed; 10 with movement--repositioned in bed and pain decreased     Hand Dominance     Extremity/Trunk Assessment Upper Extremity Assessment Upper Extremity Assessment: Overall WFL for tasks assessed           Communication Communication Communication: No difficulties   Cognition Arousal/Alertness: Awake/alert Behavior During Therapy: WFL for tasks assessed/performed Overall Cognitive Status: Within Functional Limits for tasks assessed                     General Comments       Exercises       Shoulder Instructions      Home Living Family/patient expects to be discharged to:: Private residence Living Arrangements:  (will not have anyone with her initially)   Type of Home: Apartment Home Access: Level  entry     Home Layout: One level     Bathroom Shower/Tub: Tub/shower unit Shower/tub characteristics: Architectural technologist: Standard     Home Equipment: Environmental consultant - 4 wheels;Bedside commode; pt has tub seat also--bench would not work in her bathroom          Prior Functioning/Environment Level of Independence: Independent with assistive device(s)             OT Diagnosis: Generalized weakness   OT Problem List: Pain;Decreased strength;Decreased activity tolerance   OT Treatment/Interventions: Self-care/ADL training;DME and/or AE instruction;Patient/family education    OT Goals(Current goals can be found in the care plan section) Acute Rehab OT Goals Patient Stated Goal: get back to being independent; decreased pain OT Goal Formulation: With patient Time For Goal Achievement: 04/22/14 Potential to Achieve Goals: Good ADL Goals Pt Will Perform Grooming: with supervision;standing Pt Will Perform Lower Body Dressing: with min assist;with adaptive equipment;sit to/from stand Pt Will Transfer to Toilet: with  supervision;ambulating;bedside commode Additional ADL Goal #1: pt will perform bed mobility with leg lifter with supervision in preparation for toilet transfers  OT Frequency: Min 2X/week   Barriers to D/C:            Co-evaluation              End of Session    Activity Tolerance: Patient limited by pain Patient left: in bed;with call bell/phone within reach   Time: 0807-0826 OT Time Calculation (min): 19 min Charges:  OT General Charges $OT Visit: 1 Procedure OT Evaluation $Initial OT Evaluation Tier I: 1 Procedure OT Treatments $Self Care/Home Management : 8-22 mins G-Codes:    Savan Ruta 04/30/2014, 9:33 AM  [name], OTR/L [phone] 2014-04-30

## 2014-04-15 NOTE — Progress Notes (Signed)
Physical Therapy Treatment Patient Details Name: Megan Robles MRN: [id number] DOB: 9Hewitt Blade/09/1970 Today's Date: 04/15/2014    History of Present Illness s/p R TKA    PT Comments    Slowly progressing with mobility. Plan is for d/c home on tomorrow.   Follow Up Recommendations  Home health PT;Supervision/Assistance - 24 hour     Equipment Recommendations  None recommended by PT    Recommendations for Other Services       Precautions / Restrictions Precautions Precautions: Knee Restrictions Weight Bearing Restrictions: No RLE Weight Bearing: Weight bearing as tolerated    Mobility  Bed Mobility Overal bed mobility: Needs Assistance Bed Mobility: Supine to Sit;Sit to Supine     Supine to sit: Min assist Sit to supine: Min assist   General bed mobility comments: assist for R LE.   Transfers Overall transfer level: Needs assistance Equipment used: Rolling walker (2 wheeled) Transfers: Sit to/from Stand Sit to Stand: Min guard         General transfer comment: cues for hand placement  Ambulation/Gait Ambulation/Gait assistance: Min guard Ambulation Distance (Feet): 75 Feet (25'x1, 75'x1) Assistive device: Rolling walker (2 wheeled) Gait Pattern/deviations: Step-to pattern;Antalgic;Decreased stride length     General Gait Details: slow gait speed. close guard for safety   Stairs            Wheelchair Mobility    Modified Rankin (Stroke Patients Only)       Balance                                    Cognition                            Exercises      General Comments        Pertinent Vitals/Pain 3/10 at rest; 5/10 R knee with activity.     Home Living                      Prior Function            PT Goals (current goals can now be found in the care plan section) Progress towards PT goals: Progressing toward goals (slowly)    Frequency  7X/week    PT Plan Current plan remains appropriate     Co-evaluation             End of Session   Activity Tolerance: Patient limited by pain Patient left: in bed;with call bell/phone within reach     Time: [id number] PT Time Calculation (min): 23 min  Charges:  $Gait Training: 23-37 mins                    G Codes:      Rebeca AlertJannie Lacie Robles, MPT Pager: [phone]

## 2014-04-15 NOTE — Progress Notes (Signed)
Physical Therapy Treatment Patient Details Name: Megan Robles Diallo MRN: [id number] DOB: 02/16/1971 Today's Date: 04/15/2014    History of Present Illness s/p R TKA    PT Comments    Progressing with mobility.   Follow Up Recommendations  Home health PT;Supervision/Assistance - 24 hour     Equipment Recommendations  None recommended by PT    Recommendations for Other Services       Precautions / Restrictions Precautions Precautions: Knee Restrictions Weight Bearing Restrictions: No RLE Weight Bearing: Weight bearing as tolerated    Mobility  Bed Mobility Overal bed mobility: Needs Assistance Bed Mobility: Supine to Sit;Sit to Supine     Supine to sit: Min assist Sit to supine: Min assist   General bed mobility comments: assist for R LE.   Transfers Overall transfer level: Needs assistance Equipment used: Rolling walker (2 wheeled) Transfers: Sit to/from Stand Sit to Stand: Min guard         General transfer comment: cues for hand placement  Ambulation/Gait Ambulation/Gait assistance: Min guard Ambulation Distance (Feet): 80 Feet (80'x1, 15'x1) Assistive device: Rolling walker (2 wheeled) Gait Pattern/deviations: Step-to pattern;Decreased stride length;Antalgic     General Gait Details: slow gait speed.    Stairs            Wheelchair Mobility    Modified Rankin (Stroke Patients Only)       Balance                                    Cognition Arousal/Alertness: Awake/alert Behavior During Therapy: WFL for tasks assessed/performed Overall Cognitive Status: Within Functional Limits for tasks assessed                      Exercises Total Joint Exercises Ankle Circles/Pumps: AROM;Both;10 reps;Supine Quad Sets: AROM;Both;10 reps;Supine Heel Slides: AAROM;Right;10 reps;Supine Hip ABduction/ADduction: AAROM;Right;10 reps;Supine Straight Leg Raises: AAROM;Right;10 reps;Supine Goniometric ROM: 10-50 degrees     General Comments        Pertinent Vitals/Pain 2/10 at rest; 5/10 with activity. Ice applied end of session    Home Living Family/patient expects to be discharged to:: Private residence Living Arrangements:  (will not have anyone with her initially)   Type of Home: Apartment Home Access: Level entry   Home Layout: One level Home Equipment: Walker - 4 wheels;Bedside commode      Prior Function Level of Independence: Independent with assistive device(s)          PT Goals (current goals can now be found in the care plan section) Acute Rehab PT Goals Patient Stated Goal: get back to being independent; decreased pain Progress towards PT goals: Progressing toward goals    Frequency  7X/week    PT Plan Current plan remains appropriate    Co-evaluation             End of Session   Activity Tolerance: Patient tolerated treatment well Patient left: in bed;with call bell/phone within reach     Time: [id number] PT Time Calculation (min): 29 min  Charges:  $Gait Training: 8-22 mins $Therapeutic Exercise: 8-22 mins                    G Codes:      Rebeca AlertJannie Shiquan Mathieu, MPT Pager: [phone]

## 2014-04-16 DIAGNOSIS — N390 Urinary tract infection, site not specified: Secondary | ICD-10-CM | POA: Diagnosis present

## 2014-04-16 MED ORDER — ASPIRIN 325 MG PO TBEC: 325.0000 mg | DELAYED_RELEASE_TABLET | Freq: Two times a day (BID) | ORAL | Status: AC

## 2014-04-16 MED ORDER — POLYETHYLENE GLYCOL 3350 17 G PO PACK: 17.0000 g | PACK | Freq: Two times a day (BID) | ORAL | Status: DC

## 2014-04-16 MED ORDER — OXYCODONE HCL 5 MG PO TABS: 5.0000 mg | ORAL_TABLET | ORAL | Status: DC

## 2014-04-16 MED ORDER — CIPROFLOXACIN HCL 500 MG PO TABS
500.0000 mg | ORAL_TABLET | Freq: Two times a day (BID) | ORAL | Status: DC
  Administered 2014-04-16: 500 mg via ORAL
  Filled 2014-04-16 (×3): qty 1

## 2014-04-16 MED ORDER — HYDROXYZINE HCL 25 MG PO TABS: 25.0000 mg | ORAL_TABLET | Freq: Four times a day (QID) | ORAL | Status: DC | PRN

## 2014-04-16 MED ORDER — METHOCARBAMOL 500 MG PO TABS: 500.0000 mg | ORAL_TABLET | Freq: Four times a day (QID) | ORAL | Status: DC | PRN

## 2014-04-16 MED ORDER — DSS 100 MG PO CAPS: 100.0000 mg | ORAL_CAPSULE | Freq: Two times a day (BID) | ORAL | Status: DC

## 2014-04-16 MED ORDER — CIPROFLOXACIN HCL 500 MG PO TABS: 500.0000 mg | ORAL_TABLET | Freq: Two times a day (BID) | ORAL | Status: DC

## 2014-04-16 NOTE — Progress Notes (Signed)
Physical Therapy Treatment Patient Details Name: Megan Robles Diallo MRN: [id number] DOB: 07/15/1971 Today's Date: 04/16/2014    History of Present Illness s/p R TKA    PT Comments    POD # 3 assisted pt OOB with increased time and Min Assist to support R LE to amb in hallway.  Amb distance limited by c/o fatigue esp B UE's which pt used to support self on walker.  Assisted pt back to bed per request.  Pt plans to D/C to home later today.  Follow Up Recommendations  Home health PT;Supervision/Assistance - 24 hour     Equipment Recommendations  None recommended by PT    Recommendations for Other Services       Precautions / Restrictions Precautions Precautions: Knee Restrictions Weight Bearing Restrictions: No RLE Weight Bearing: Weight bearing as tolerated    Mobility  Bed Mobility Overal bed mobility: Needs Assistance Bed Mobility: Supine to Sit;Sit to Supine     Supine to sit: Min assist Sit to supine: Min assist   General bed mobility comments: assist for R LE and increased time.  Pt stated she has a leg lifter but left it at home.    Transfers Overall transfer level: Needs assistance Equipment used: Rolling walker (2 wheeled) Transfers: Sit to/from Stand Sit to Stand: Supervision         General transfer comment: increased time and asssit to advance R LE forward and support esp with stand to sit.    Ambulation/Gait Ambulation/Gait assistance: Min guard;Supervision Ambulation Distance (Feet): 65 Feet Assistive device: Rolling walker (2 wheeled) Gait Pattern/deviations: Step-to pattern;Decreased stance time - right Gait velocity: decreased   General Gait Details: good safety cognition.  Limited amb distance due to c/o UE weakness supporting self on walker.     Stairs Stairs:  (Pt has a ramp )          Wheelchair Mobility    Modified Rankin (Stroke Patients Only)       Balance                                    Cognition  Arousal/Alertness: Awake/alert Behavior During Therapy: WFL for tasks assessed/performed Overall Cognitive Status: Within Functional Limits for tasks assessed                      Exercises      General Comments        Pertinent Vitals/Pain C/o 8/10 R knee pain esp "top of knee cap" ICE applied    Home Living                      Prior Function            PT Goals (current goals can now be found in the care plan section) Progress towards PT goals: Progressing toward goals    Frequency       PT Plan Current plan remains appropriate    Co-evaluation             End of Session   Activity Tolerance: Patient limited by pain;Patient limited by fatigue Patient left: in bed;with call bell/phone within reach (Pt requested to go back to bed)     Time: [id number] PT Time Calculation (min): 25 min  Charges:  $Gait Training: 8-22 mins $Therapeutic Activity: 8-22 mins  G Codes:      Rica Koyanagi  PTA [facility]  Acute  Rehab Pager      [phone]

## 2014-04-16 NOTE — Progress Notes (Signed)
   Subjective: 3 Days Post-Op Procedure(s) (LRB): RIGHT TOTAL KNEE ARTHROPLASTY (Right)   Patient reports pain as mild, pain controlled. No events throughout the night. Ready to be discharged home.  Objective:   VITALS:   Filed Vitals:   04/16/14 0620  BP: 129/71  Pulse: 94  Temp: 97.8 F (36.6 C)  Resp: 17    Dorsiflexion/Plantar flexion intact Incision: dressing C/D/I No cellulitis present Compartment soft  LABS  Recent Labs  04/14/14 0440 04/15/14 0442  HGB 7.9* 7.6*  HCT 26.6* 24.3*  WBC 13.7* 12.4*  PLT 488* 441*     Recent Labs  04/14/14 0440 04/15/14 0442  NA 138 138  K 4.0 4.0  BUN 9 9  CREATININE 0.59 0.68  GLUCOSE 124* 107*     Assessment/Plan: 3 Days Post-Op Procedure(s) (LRB): RIGHT TOTAL KNEE ARTHROPLASTY (Right) Up with therapy Discharge home with home health [facility]. Follow up with OLIN,Mariely Mahr D in 2 weeks.  Contact information:  [facility] [address], [address] [place] [id number] [phone]    UTI Prior to admission, continued treatment  Expected ABLA  Treated with iron and will observe   Morbid Obesity (BMI >40)  Estimated body mass index is 49.41 kg/(m^2) as calculated from the following:      Height as of this encounter: 5\' 4"  (1.626 m).      Weight as of this encounter: 130.636 kg (288 lb).  Patient also counseled that weight may inhibit the healing process  Patient counseled that losing weight will help with future health issues     Megan Robles   PAC  04/16/2014, 7:53 AM

## 2014-04-16 NOTE — Progress Notes (Signed)
Occupational Therapy Treatment Patient Details Name: Megan Robles MRN: [id number] DOB: 02/24/1971 Today's Date: 04/16/2014    History of present illness s/p R TKA   OT comments  Pt making good progress.  Has arranged for help at home.    Follow Up Recommendations  No OT follow up;Supervision/Assistance - 24 hour (initially)    Equipment Recommendations       Recommendations for Other Services      Precautions / Restrictions Precautions Precautions: Knee Restrictions Weight Bearing Restrictions: No RLE Weight Bearing: Weight bearing as tolerated       Mobility Bed Mobility         Supine to sit: Min assist     General bed mobility comments: assist for R LE.   Transfers       Sit to Stand: Supervision         General transfer comment: cues for UE placement    Balance                                   ADL       Grooming: Wash/dry hands;Supervision/safety;Standing       Lower Body Bathing: Minimal assistance;Sit to/from stand;With adaptive equipment       Lower Body Dressing: Minimal assistance;With adaptive equipment;Sit to/from stand                 General ADL Comments: performed adl with reacher, sit to stand.  Pt ambulated to bathroom with supervision and completed bed mobility with min A.  R knee still painful with movement, but this has improved from last OT session.  Pt has arranged for some help at home      Vision                     Perception     Praxis      Cognition   Behavior During Therapy: [facility] for tasks assessed/performed Overall Cognitive Status: Within Functional Limits for tasks assessed                       Extremity/Trunk Assessment               Exercises     Shoulder Instructions       General Comments      Pertinent Vitals/ Pain       0 at rest; moderate with movement.  Repositioned and ice brought for after PT (who just arrived)  Home Living                                           Prior Functioning/Environment              Frequency Min 2X/week     Progress Toward Goals  OT Goals(current goals can now be found in the care plan section)  Progress towards OT goals: Progressing toward goals     Plan      Co-evaluation                 End of Session CPM Right Knee CPM Right Knee: Off   Activity Tolerance Patient tolerated treatment well   Patient Left in bed;with call bell/phone within reach   Nurse Communication          Time: [phone]  OT Time Calculation (min): 34 min  Charges: OT General Charges $OT Visit: 1 Procedure OT Treatments $Self Care/Home Management : 23-37 mins  Dene Landsberg 04/16/2014, 10:59 AM  Marica Otter, OTR/L [phone] 04/16/2014

## 2014-04-19 NOTE — Discharge Summary (Signed)
Physician Discharge Summary  Patient ID: Megan Robles MRN: [id number] DOB/AGE: 41/09/1970 41 y.o.  Admit date: 04/13/2014 Discharge date: 04/16/2014   Procedures:  Procedure(s) (LRB): RIGHT TOTAL KNEE ARTHROPLASTY (Right)  Attending Physician:  Dr. Durene RomansMatthew Olin   Admission Diagnoses:   Right knee OA / pain  Discharge Diagnoses:  Principal Problem:   S/P right TKA Active Problems:   Expected blood loss anemia   Morbid obesity   UTI (urinary tract infection)  Past Medical History  Diagnosis Date  . Hypertension   . Anemia   . Arthritis   . Fibromyalgia   . Depression   . History of blood transfusion 2004/2015  . Pneumonia   . URI (upper respiratory infection) 03/19/14    productive cough with yellow nasal discharge with fever- did not seek treatment- 7/17/15states resolved except occ cough now    HPI: Megan Robles, 43 y.o. female, has a history of pain and functional disability in the right knee due to arthritis and has failed non-surgical conservative treatments for greater than 12 weeks to include NSAID's and/or analgesics, corticosteriod injections, viscosupplementation injections, use of assistive devices and activity modification. Onset of symptoms was gradual, starting >10 years ago with gradually worsening course since that time. The patient noted no past surgery on the right knee(s). Patient currently rates pain in the right knee(s) at 8 out of 10 with activity. Patient has night pain, worsening of pain with activity and weight bearing, pain that interferes with activities of daily living, pain with passive range of motion, crepitus and joint swelling. Patient has evidence of periarticular osteophytes and joint space narrowing by imaging studies. There is no active infection. Risks, benefits and expectations were discussed with the patient. Risks including but not limited to the risk of anesthesia, blood clots, nerve damage, blood vessel damage, failure of the prosthesis,  infection and up to and including death. Patient understand the risks, benefits and expectations and wishes to proceed with surgery.   PCP: [name], GILLIAN W, NP   Discharged Condition: good  Hospital Course:  Patient underwent the above stated procedure on 04/13/2014. Patient tolerated the procedure well and brought to the recovery room in good condition and subsequently to the floor.  POD #1 BP: 146/83 ; Pulse: 76 ; Temp: 97.6 F (36.4 C) ; Resp: 19  Patient reports pain as mild, 3 on a 10 scale. Muscle spasms increase the pain significantly from time to time. Complaining of itching, believes it is from some of the medication. States she didn't have a good night. No events otherwise. Dorsiflexion/plantar flexion intact, incision: dressing C/D/I, no cellulitis present and compartment soft.   LABS  Basename    HGB  7.9  HCT  26.6   POD #2  BP: 106/67 ; Pulse: 76 ; Temp: 97.3 F (36.3 C) ; Resp: 19  Patient reports pain as moderate, pain controled. No events throughout the night. Indecisive on plans upon discharge, will figure it out today. Will also work on itching as well as PT.  Dorsiflexion/plantar flexion intact, incision: dressing C/D/I, no cellulitis present and compartment soft.   LABS  Basename    HGB  7.6  HCT  24.3   POD #3  BP: 129/71 ; Pulse: 94 ; Temp: 97.8 F (36.6 C) ; Resp: 17  Patient reports pain as mild, pain controlled. No events throughout the night. Ready to be discharged home. Dorsiflexion/plantar flexion intact, incision: dressing C/D/I, no cellulitis present and compartment soft.   LABS  No new labs  Discharge Exam: General appearance: alert, cooperative and no distress Extremities: Homans sign is negative, no sign of DVT, no edema, redness or tenderness in the calves or thighs and no ulcers, gangrene or trophic changes  Disposition: Home with follow up in 2 weeks   Follow-up Information   Follow up with [name], MD. Schedule an appointment  as soon as possible for a visit in 2 weeks.   Specialty:  Orthopedic Surgery   Contact information:   [address] [place] [address] [phone]       Discharge Instructions   Call MD / Call 911    Complete by:  As directed   If you experience chest pain or shortness of breath, CALL 911 and be transported to the hospital emergency room.  If you develope a fever above 101 F, pus (white drainage) or increased drainage or redness at the wound, or calf pain, call your surgeon's office.     Change dressing    Complete by:  As directed   Maintain surgical dressing for 10-14 days, or until follow up in the clinic.     Constipation Prevention    Complete by:  As directed   Drink plenty of fluids.  Prune juice may be helpful.  You may use a stool softener, such as Colace (over the counter) 100 mg twice a day.  Use MiraLax (over the counter) for constipation as needed.     Diet - low sodium heart healthy    Complete by:  As directed      Discharge instructions    Complete by:  As directed   Maintain surgical dressing for 10-14 days, or until follow up in the clinic. Follow up in 2 weeks at [facility]. Call with any questions or concerns.     Driving restrictions    Complete by:  As directed   No driving for 4 weeks     Increase activity slowly as tolerated    Complete by:  As directed      TED hose    Complete by:  As directed   Use stockings (TED hose) for 2 weeks on both leg(s).  You may remove them at night for sleeping.     Weight bearing as tolerated    Complete by:  As directed   Laterality:  right  Extremity:  Lower             Medication List    STOP taking these medications       diclofenac sodium 1 % Gel  Commonly known as:  VOLTAREN     HYDROcodone-acetaminophen 7.5-325 MG per tablet  Commonly known as:  NORCO      TAKE these medications       aspirin 325 MG EC tablet  Take 1 tablet (325 mg total) by mouth 2 (two) times  daily.     ciprofloxacin 500 MG tablet  Commonly known as:  CIPRO  Take 1 tablet (500 mg total) by mouth 2 (two) times daily.     DSS 100 MG Caps  Take 100 mg by mouth 2 (two) times daily.     DULoxetine 60 MG capsule  Commonly known as:  CYMBALTA  Take 60 mg by mouth at bedtime.     ferrous sulfate 325 (65 FE) MG tablet  Take 1 tablet (325 mg total) by mouth 3 (three) times daily after meals.     hydrochlorothiazide 12.5 MG capsule  Commonly known  as:  MICROZIDE  Take 25 mg by mouth at bedtime.     hydrOXYzine 25 MG tablet  Commonly known as:  ATARAX/VISTARIL  Take 1 tablet (25 mg total) by mouth every 6 (six) hours as needed for itching.     methocarbamol 500 MG tablet  Commonly known as:  ROBAXIN  Take 1 tablet (500 mg total) by mouth every 6 (six) hours as needed for muscle spasms.     oxyCODONE 5 MG immediate release tablet  Commonly known as:  Oxy IR/ROXICODONE  Take 1-3 tablets (5-15 mg total) by mouth every 4 (four) hours.     polyethylene glycol packet  Commonly known as:  MIRALAX / GLYCOLAX  Take 17 g by mouth 2 (two) times daily.         Signed: Anastasio Auerbach. Coraline Talwar   PA-C  04/19/2014, 9:58 AM

## 2014-04-19 NOTE — Progress Notes (Signed)
   Subjective: 2 Days Post-Op Procedure(s) (LRB): RIGHT TOTAL KNEE ARTHROPLASTY (Right)   Patient reports pain as moderate, pain controled. No events throughout the night. Indecisive on plans upon discharge, will figure it out today. Will also work on itching as well as PT.   Objective:   VITALS:   Filed Vitals:   04/15/14  BP: 106/67  Pulse: 76  Temp: 97.3 F (36.3 C)  Resp: 19    Dorsiflexion/Plantar flexion intact Incision: dressing C/D/I No cellulitis present Compartment soft  LABS   Recent Labs   04/14/14 0440  04/15/14 0442   HGB  7.9*  7.6*   HCT  26.6*  24.3*   WBC  13.7*  12.4*   PLT  488*  441*     Recent Labs   04/14/14 0440  04/15/14 0442   NA  138  138   K  4.0  4.0   BUN  9  9   CREATININE  0.59  0.68   GLUCOSE  124*  107*     Assessment/Plan: 2 Days Post-Op Procedure(s) (LRB): RIGHT TOTAL KNEE ARTHROPLASTY (Right) Up with therapy Discharge home with home health with personal care assistant vs SNF  Expected ABLA  Treated with iron and will observe   Morbid Obesity (BMI >40)  Estimated body mass index is 49.41 kg/(m^2) as calculated from the following:      Height as of this encounter: 5\' 4"  (1.626 m).      Weight as of this encounter: 130.636 kg (288 lb).  Patient also counseled that weight may inhibit the healing process  Patient counseled that losing weight will help with future health issues      Anastasio AuerbachMatthew S. Starr Engel   PAC  04/19/2014, 9:59 AM

## 2014-09-24 HISTORY — PX: ABDOMINAL HYSTERECTOMY: SHX81

## 2014-09-24 HISTORY — PX: LAPAROSCOPIC GASTRIC SLEEVE RESECTION: SHX5895

## 2014-11-03 ENCOUNTER — Encounter (HOSPITAL_BASED_OUTPATIENT_CLINIC_OR_DEPARTMENT_OTHER): Payer: Self-pay

## 2014-11-03 ENCOUNTER — Emergency Department (HOSPITAL_BASED_OUTPATIENT_CLINIC_OR_DEPARTMENT_OTHER)
Admission: EM | Admit: 2014-11-03 | Discharge: 2014-11-03 | Disposition: A | Discharge status: Home / Self Care | Payer: Medicare Other | Attending: Emergency Medicine | Admitting: Emergency Medicine

## 2014-11-03 DIAGNOSIS — B9789 Other viral agents as the cause of diseases classified elsewhere: Secondary | ICD-10-CM

## 2014-11-03 DIAGNOSIS — M797 Fibromyalgia: Secondary | ICD-10-CM | POA: Diagnosis not present

## 2014-11-03 DIAGNOSIS — R05 Cough: Secondary | ICD-10-CM | POA: Diagnosis present

## 2014-11-03 DIAGNOSIS — I10 Essential (primary) hypertension: Secondary | ICD-10-CM | POA: Diagnosis not present

## 2014-11-03 DIAGNOSIS — F329 Major depressive disorder, single episode, unspecified: Secondary | ICD-10-CM | POA: Insufficient documentation

## 2014-11-03 DIAGNOSIS — Z8701 Personal history of pneumonia (recurrent): Secondary | ICD-10-CM | POA: Insufficient documentation

## 2014-11-03 DIAGNOSIS — J069 Acute upper respiratory infection, unspecified: Secondary | ICD-10-CM | POA: Insufficient documentation

## 2014-11-03 DIAGNOSIS — Z862 Personal history of diseases of the blood and blood-forming organs and certain disorders involving the immune mechanism: Secondary | ICD-10-CM | POA: Diagnosis not present

## 2014-11-03 MED ORDER — FLUTICASONE PROPIONATE 50 MCG/ACT NA SUSP: 2.0000 | Freq: Every day | NASAL | Status: DC

## 2014-11-03 MED ORDER — ALBUTEROL SULFATE HFA 108 (90 BASE) MCG/ACT IN AERS
2.0000 | INHALATION_SPRAY | RESPIRATORY_TRACT | Status: DC | PRN
  Administered 2014-11-03: 2 via RESPIRATORY_TRACT
  Filled 2014-11-03: qty 6.7

## 2014-11-03 MED ORDER — AEROCHAMBER PLUS W/MASK MISC
1.0000 | Freq: Once | Status: AC
  Administered 2014-11-03: 1
  Filled 2014-11-03: qty 1

## 2014-11-03 MED ORDER — BENZONATATE 100 MG PO CAPS: 200.0000 mg | ORAL_CAPSULE | Freq: Two times a day (BID) | ORAL | Status: DC | PRN

## 2014-11-03 NOTE — ED Notes (Signed)
Dry cough since yesterday. Denies sick contacts. Reports her throat is burning

## 2014-11-03 NOTE — ED Provider Notes (Signed)
CSN: [id number]     Arrival date & time 11/03/14  1733 History   First MD Initiated Contact with Patient 11/03/14 1750     Chief Complaint  Patient presents with  . Cough     (Consider location/radiation/quality/duration/timing/severity/associated sxs/prior Treatment) Patient is a 44 y.o. female presenting with cough. The history is provided by the patient and medical records. No language interpreter was used.  Cough Associated symptoms: headaches, rhinorrhea and sore throat   Associated symptoms: no chest pain, no chills, no ear pain, no fever, no myalgias, no rash, no shortness of breath and no wheezing      Megan Robles is a 44 y.o. female  with a hx of hypertension, anemia, arthritis, fibromyalgia, depression presents to the Emergency Department complaining of gradual, persistent, progressively worsening nonproductive cough, bilateral otalgia, sore throat, rhinorrhea, nasal congestion, postnasal drip onset yesterday. Patient reports no treatments prior to arrival.  Nothing makes symptoms better or worse. She denies sick contacts. Patient denies smoking.  She denies fever, chills, neck pain, neck stiffness, chest pain, shortness of breath, abdominal pain, nausea, vomiting, diarrhea.    Past Medical History  Diagnosis Date  . Hypertension   . Anemia   . Arthritis   . Fibromyalgia   . Depression   . History of blood transfusion 2004/2015  . Pneumonia   . URI (upper respiratory infection) 03/19/14    productive cough with yellow nasal discharge with fever- did not seek treatment- 7/17/15states resolved except occ cough now   Past Surgical History  Procedure Laterality Date  . Cholecystectomy  2004  . Dilation and curettage of uterus  2004  . Esophagogastroduodenoscopy  02/15/2012    Procedure: ESOPHAGOGASTRODUODENOSCOPY (EGD);  Surgeon: Theda Belfast, MD;  Location: [facility];  Service: Endoscopy;  Laterality: N/A;  . Colonoscopy  02/15/2012    Procedure: COLONOSCOPY;   Surgeon: Theda Belfast, MD;  Location: [facility];  Service: Endoscopy;  Laterality: N/A;  . Total hip arthroplasty Left 01/26/2014    Procedure: LEFT TOTAL HIP ARTHROPLASTY ANTERIOR APPROACH;  Surgeon: [name], MD;  Location: [facility];  Service: Orthopedics;  Laterality: Left;  . Total knee arthroplasty Right 04/13/2014    Procedure: RIGHT TOTAL KNEE ARTHROPLASTY;  Surgeon: [name], MD;  Location: [facility];  Service: Orthopedics;  Laterality: Right;   No family history on file. History  Substance Use Topics  . Smoking status: Never Smoker   . Smokeless tobacco: Never Used  . Alcohol Use: No   OB History    No data available     Review of Systems  Constitutional: Negative for fever, chills, appetite change and fatigue.  HENT: Positive for congestion, postnasal drip, rhinorrhea, sinus pressure and sore throat. Negative for ear discharge, ear pain and mouth sores.   Eyes: Negative for visual disturbance.  Respiratory: Positive for cough. Negative for chest tightness, shortness of breath, wheezing and stridor.   Cardiovascular: Negative for chest pain, palpitations and leg swelling.  Gastrointestinal: Negative for nausea, vomiting, abdominal pain and diarrhea.  Genitourinary: Negative for dysuria, urgency, frequency and hematuria.  Musculoskeletal: Negative for myalgias, back pain, arthralgias and neck stiffness.  Skin: Negative for rash.  Neurological: Positive for headaches. Negative for syncope, light-headedness and numbness.  Hematological: Negative for adenopathy.  Psychiatric/Behavioral: The patient is not nervous/anxious.   All other systems reviewed and are negative.     Allergies  Banana; Kiwi extract; and Pineapple  Home Medications   Prior to Admission medications  Medication Sig Start Date End Date Taking? Authorizing Provider  benzonatate (TESSALON) 100 MG capsule Take 2 capsules (200 mg total) by mouth 2 (two) times daily as needed for cough. 11/03/14    Ashelyn Mccravy, PA-C  DULoxetine (CYMBALTA) 60 MG capsule Take 60 mg by mouth at bedtime.     Historical Provider, MD  fluticasone (FLONASE) 50 MCG/ACT nasal spray Place 2 sprays into both nostrils daily. 11/03/14   Valrie Jia, PA-C  hydrochlorothiazide (MICROZIDE) 12.5 MG capsule Take 25 mg by mouth at bedtime.    Historical Provider, MD   BP 159/86 mmHg  Pulse 90  Temp(Src) 99.3 F (37.4 C) (Oral)  Resp 18  Ht 5\' 4"  (1.626 m)  Wt 317 lb 2 oz (143.847 kg)  BMI 54.41 kg/m2  SpO2 100%  LMP 10/25/2014 Physical Exam  Constitutional: She is oriented to person, place, and time. She appears well-developed and well-nourished. No distress.  HENT:  Head: Normocephalic and atraumatic.  Right Ear: Tympanic membrane, external ear and ear canal normal.  Left Ear: Tympanic membrane, external ear and ear canal normal.  Nose: Mucosal edema and rhinorrhea present. No epistaxis. Right sinus exhibits no maxillary sinus tenderness and no frontal sinus tenderness. Left sinus exhibits no maxillary sinus tenderness and no frontal sinus tenderness.  Mouth/Throat: Uvula is midline, oropharynx is clear and moist and mucous membranes are normal. Mucous membranes are not pale and not cyanotic. No oropharyngeal exudate, posterior oropharyngeal edema, posterior oropharyngeal erythema or tonsillar abscesses.  Eyes: Conjunctivae are normal. Pupils are equal, round, and reactive to light.  Neck: Normal range of motion and full passive range of motion without pain.  Cardiovascular: Normal rate, normal heart sounds and intact distal pulses.   No murmur heard. Pulmonary/Chest: Effort normal and breath sounds normal. No stridor.  Clear and equal breath sounds without focal wheezes, rhonchi, rales  Abdominal: Soft. Bowel sounds are normal. There is no tenderness.  Musculoskeletal: Normal range of motion.  Lymphadenopathy:    She has no cervical adenopathy.  Neurological: She is alert and oriented to person,  place, and time.  Skin: Skin is warm and dry. No rash noted. She is not diaphoretic.  Psychiatric: She has a normal mood and affect.  Nursing note and vitals reviewed.   ED Course  Procedures (including critical care time) Labs Review Labs Reviewed - No data to display  Imaging Review No results found.   EKG Interpretation None      MDM   Final diagnoses:  Viral URI with cough   Megan Robles presents with dry cough and URI symptoms. Pt afebrile with clear and equal breath sounds, no chest x-ray indicated this time. Highly doubt pneumonia. Patients symptoms are consistent with URI, likely viral etiology. Discussed that antibiotics are not indicated for viral infections. Pt will be discharged with symptomatic treatment.  Verbalizes understanding and is agreeable with plan. Pt is hemodynamically stable & in NAD prior to dc.  I have personally reviewed patient's vitals, nursing note and any pertinent labs or imaging.  I performed an focused physical exam; undressed when appropriate .    It has been determined that no acute conditions requiring further emergency intervention are present at this time. The patient/guardian have been advised of the diagnosis and plan. I reviewed any labs and imaging including any potential incidental findings. We have discussed signs and symptoms that warrant return to the ED and they are listed in the discharge instructions.    Vital signs are stable at discharge.  BP 159/86 mmHg  Pulse 90  Temp(Src) 99.3 F (37.4 C) (Oral)  Resp 18  Ht  (1.626 m)  Wt 317 lb 2 oz (143.847 kg)  BMI 54.41 kg/m2  SpO2 100%  LMP 10/25/2014         Dierdre Forth, PA-C 11/03/14 1825  Audree Camel, MD 11/03/14 1919

## 2014-11-03 NOTE — Discharge Instructions (Signed)
1. Medications: Flonase, albuterol, mucinex, tessalon, usual home medications 2. Treatment: rest, drink plenty of fluids, take tylenol or ibuprofen for fever control 3. Follow Up: Please followup with your primary doctor in 3 days for discussion of your diagnoses and further evaluation after today's visit; if you do not have a primary care doctor use the resource guide provided to find one; Return to the ER for high fevers, difficulty breathing or other concerning symptoms    Upper Respiratory Infection, Adult An upper respiratory infection (URI) is also sometimes known as the common cold. The upper respiratory tract includes the nose, sinuses, throat, trachea, and bronchi. Bronchi are the airways leading to the lungs. Most people improve within 1 week, but symptoms can last up to 2 weeks. A residual cough may last even longer.  CAUSES Many different viruses can infect the tissues lining the upper respiratory tract. The tissues become irritated and inflamed and often become very moist. Mucus production is also common. A cold is contagious. You can easily spread the virus to others by oral contact. This includes kissing, sharing a glass, coughing, or sneezing. Touching your mouth or nose and then touching a surface, which is then touched by another person, can also spread the virus. SYMPTOMS  Symptoms typically develop 1 to 3 days after you come in contact with a cold virus. Symptoms vary from person to person. They may include:  Runny nose.  Sneezing.  Nasal congestion.  Sinus irritation.  Sore throat.  Loss of voice (laryngitis).  Cough.  Fatigue.  Muscle aches.  Loss of appetite.  Headache.  Low-grade fever. DIAGNOSIS  You might diagnose your own cold based on familiar symptoms, since most people get a cold 2 to 3 times a year. Your caregiver can confirm this based on your exam. Most importantly, your caregiver can check that your symptoms are not due to another disease such as  strep throat, sinusitis, pneumonia, asthma, or epiglottitis. Blood tests, throat tests, and X-rays are not necessary to diagnose a common cold, but they may sometimes be helpful in excluding other more serious diseases. Your caregiver will decide if any further tests are required. RISKS AND COMPLICATIONS  You may be at risk for a more severe case of the common cold if you smoke cigarettes, have chronic heart disease (such as heart failure) or lung disease (such as asthma), or if you have a weakened immune system. The very young and very old are also at risk for more serious infections. Bacterial sinusitis, middle ear infections, and bacterial pneumonia can complicate the common cold. The common cold can worsen asthma and chronic obstructive pulmonary disease (COPD). Sometimes, these complications can require emergency medical care and may be life-threatening. PREVENTION  The best way to protect against getting a cold is to practice good hygiene. Avoid oral or hand contact with people with cold symptoms. Wash your hands often if contact occurs. There is no clear evidence that vitamin C, vitamin E, echinacea, or exercise reduces the chance of developing a cold. However, it is always recommended to get plenty of rest and practice good nutrition. TREATMENT  Treatment is directed at relieving symptoms. There is no cure. Antibiotics are not effective, because the infection is caused by a virus, not by bacteria. Treatment may include:  Increased fluid intake. Sports drinks offer valuable electrolytes, sugars, and fluids.  Breathing heated mist or steam (vaporizer or shower).  Eating chicken soup or other clear broths, and maintaining good nutrition.  Getting plenty of rest.  Using gargles or lozenges for comfort.  Controlling fevers with ibuprofen or acetaminophen as directed by your caregiver.  Increasing usage of your inhaler if you have asthma. Zinc gel and zinc lozenges, taken in the first 24 hours  of the common cold, can shorten the duration and lessen the severity of symptoms. Pain medicines may help with fever, muscle aches, and throat pain. A variety of non-prescription medicines are available to treat congestion and runny nose. Your caregiver can make recommendations and may suggest nasal or lung inhalers for other symptoms.  HOME CARE INSTRUCTIONS   Only take over-the-counter or prescription medicines for pain, discomfort, or fever as directed by your caregiver.  Use a warm mist humidifier or inhale steam from a shower to increase air moisture. This may keep secretions moist and make it easier to breathe.  Drink enough water and fluids to keep your urine clear or pale yellow.  Rest as needed.  Return to work when your temperature has returned to normal or as your caregiver advises. You may need to stay home longer to avoid infecting others. You can also use a face mask and careful hand washing to prevent spread of the virus. SEEK MEDICAL CARE IF:   After the first few days, you feel you are getting worse rather than better.  You need your caregiver's advice about medicines to control symptoms.  You develop chills, worsening shortness of breath, or brown or red sputum. These may be signs of pneumonia.  You develop yellow or brown nasal discharge or pain in the face, especially when you bend forward. These may be signs of sinusitis.  You develop a fever, swollen neck glands, pain with swallowing, or white areas in the back of your throat. These may be signs of strep throat. SEEK IMMEDIATE MEDICAL CARE IF:   You have a fever.  You develop severe or persistent headache, ear pain, sinus pain, or chest pain.  You develop wheezing, a prolonged cough, cough up blood, or have a change in your usual mucus (if you have chronic lung disease).  You develop sore muscles or a stiff neck. Document Released: 03/06/2001 Document Revised: 12/03/2011 Document Reviewed: 12/16/2013 [facility]  Patient Information 2015 [employer], [place]. This information is not intended to replace advice given to you by your health care provider. Make sure you discuss any questions you have with your health care provider.

## 2014-11-27 ENCOUNTER — Emergency Department (HOSPITAL_BASED_OUTPATIENT_CLINIC_OR_DEPARTMENT_OTHER): Payer: Medicare Other

## 2014-11-27 ENCOUNTER — Encounter (HOSPITAL_BASED_OUTPATIENT_CLINIC_OR_DEPARTMENT_OTHER): Payer: Self-pay | Admitting: *Deleted

## 2014-11-27 ENCOUNTER — Emergency Department (HOSPITAL_BASED_OUTPATIENT_CLINIC_OR_DEPARTMENT_OTHER)
Admission: EM | Admit: 2014-11-27 | Discharge: 2014-11-27 | Disposition: A | Discharge status: Home / Self Care | Payer: Medicare Other | Attending: Emergency Medicine | Admitting: Emergency Medicine

## 2014-11-27 DIAGNOSIS — Z3202 Encounter for pregnancy test, result negative: Secondary | ICD-10-CM | POA: Diagnosis not present

## 2014-11-27 DIAGNOSIS — F329 Major depressive disorder, single episode, unspecified: Secondary | ICD-10-CM | POA: Diagnosis not present

## 2014-11-27 DIAGNOSIS — D649 Anemia, unspecified: Secondary | ICD-10-CM | POA: Insufficient documentation

## 2014-11-27 DIAGNOSIS — S3992XA Unspecified injury of lower back, initial encounter: Secondary | ICD-10-CM | POA: Diagnosis present

## 2014-11-27 DIAGNOSIS — Z8701 Personal history of pneumonia (recurrent): Secondary | ICD-10-CM | POA: Diagnosis not present

## 2014-11-27 DIAGNOSIS — Y9289 Other specified places as the place of occurrence of the external cause: Secondary | ICD-10-CM | POA: Diagnosis not present

## 2014-11-27 DIAGNOSIS — W19XXXA Unspecified fall, initial encounter: Secondary | ICD-10-CM

## 2014-11-27 DIAGNOSIS — W01198A Fall on same level from slipping, tripping and stumbling with subsequent striking against other object, initial encounter: Secondary | ICD-10-CM | POA: Insufficient documentation

## 2014-11-27 DIAGNOSIS — S335XXA Sprain of ligaments of lumbar spine, initial encounter: Secondary | ICD-10-CM | POA: Insufficient documentation

## 2014-11-27 DIAGNOSIS — Y998 Other external cause status: Secondary | ICD-10-CM | POA: Insufficient documentation

## 2014-11-27 DIAGNOSIS — Z8739 Personal history of other diseases of the musculoskeletal system and connective tissue: Secondary | ICD-10-CM | POA: Insufficient documentation

## 2014-11-27 DIAGNOSIS — Z7951 Long term (current) use of inhaled steroids: Secondary | ICD-10-CM | POA: Diagnosis not present

## 2014-11-27 DIAGNOSIS — S0083XA Contusion of other part of head, initial encounter: Secondary | ICD-10-CM | POA: Diagnosis not present

## 2014-11-27 DIAGNOSIS — T148XXA Other injury of unspecified body region, initial encounter: Secondary | ICD-10-CM

## 2014-11-27 DIAGNOSIS — Z8709 Personal history of other diseases of the respiratory system: Secondary | ICD-10-CM | POA: Insufficient documentation

## 2014-11-27 DIAGNOSIS — Y9389 Activity, other specified: Secondary | ICD-10-CM | POA: Insufficient documentation

## 2014-11-27 DIAGNOSIS — I10 Essential (primary) hypertension: Secondary | ICD-10-CM | POA: Diagnosis not present

## 2014-11-27 DIAGNOSIS — S239XXA Sprain of unspecified parts of thorax, initial encounter: Secondary | ICD-10-CM

## 2014-11-27 LAB — URINALYSIS, ROUTINE W REFLEX MICROSCOPIC
Bilirubin Urine: NEGATIVE
GLUCOSE, UA: NEGATIVE mg/dL
HGB URINE DIPSTICK: NEGATIVE
Ketones, ur: NEGATIVE mg/dL
NITRITE: NEGATIVE
PH: 6 (ref 5.0–8.0)
Protein, ur: NEGATIVE mg/dL
Specific Gravity, Urine: 1.024 (ref 1.005–1.030)
Urobilinogen, UA: 0.2 mg/dL (ref 0.0–1.0)

## 2014-11-27 LAB — URINE MICROSCOPIC-ADD ON

## 2014-11-27 LAB — PREGNANCY, URINE: Preg Test, Ur: NEGATIVE

## 2014-11-27 NOTE — ED Notes (Signed)
Slipped and fell out of tub, landed on back side on tile floor, c/o generalized body pain all over, "everything hurts when I move", taking her usual daily pain meds w/o relief.  alert, NAD, calm, interactive, resps e/u, speaking in clear complete sentences. [facility]

## 2014-11-27 NOTE — Discharge Instructions (Signed)
Back Pain, Adult Low back pain is very common. About 1 in 5 people have back pain.The cause of low back pain is rarely dangerous. The pain often gets better over time.About half of people with a sudden onset of back pain feel better in just 2 weeks. About 8 in 10 people feel better by 6 weeks.  CAUSES Some common causes of back pain include:  Strain of the muscles or ligaments supporting the spine.  Wear and tear (degeneration) of the spinal discs.  Arthritis.  Direct injury to the back. DIAGNOSIS Most of the time, the direct cause of low back pain is not known.However, back pain can be treated effectively even when the exact cause of the pain is unknown.Answering your caregiver's questions about your overall health and symptoms is one of the most accurate ways to make sure the cause of your pain is not dangerous. If your caregiver needs more information, he or she may order lab work or imaging tests (X-rays or MRIs).However, even if imaging tests show changes in your back, this usually does not require surgery. HOME CARE INSTRUCTIONS For many people, back pain returns.Since low back pain is rarely dangerous, it is often a condition that people can learn to manageon their own.   Remain active. It is stressful on the back to sit or stand in one place. Do not sit, drive, or stand in one place for more than 30 minutes at a time. Take short walks on level surfaces as soon as pain allows.Try to increase the length of time you walk each day.  Do not stay in bed.Resting more than 1 or 2 days can delay your recovery.  Do not avoid exercise or work.Your body is made to move.It is not dangerous to be active, even though your back may hurt.Your back will likely heal faster if you return to being active before your pain is gone.  Pay attention to your body when you bend and lift. Many people have less discomfortwhen lifting if they bend their knees, keep the load close to their bodies,and  avoid twisting. Often, the most comfortable positions are those that put less stress on your recovering back.  Find a comfortable position to sleep. Use a firm mattress and lie on your side with your knees slightly bent. If you lie on your back, put a pillow under your knees.  Only take over-the-counter or prescription medicines as directed by your caregiver. Over-the-counter medicines to reduce pain and inflammation are often the most helpful.Your caregiver may prescribe muscle relaxant drugs.These medicines help dull your pain so you can more quickly return to your normal activities and healthy exercise.  Put ice on the injured area.  Put ice in a plastic bag.  Place a towel between your skin and the bag.  Leave the ice on for 15-20 minutes, 03-04 times a day for the first 2 to 3 days. After that, ice and heat may be alternated to reduce pain and spasms.  Ask your caregiver about trying back exercises and gentle massage. This may be of some benefit.  Avoid feeling anxious or stressed.Stress increases muscle tension and can worsen back pain.It is important to recognize when you are anxious or stressed and learn ways to manage it.Exercise is a great option. SEEK MEDICAL CARE IF:  You have pain that is not relieved with rest or medicine.  You have pain that does not improve in 1 week.  You have new symptoms.  You are generally not feeling well. SEEK   IMMEDIATE MEDICAL CARE IF:   You have pain that radiates from your back into your legs.  You develop new bowel or bladder control problems.  You have unusual weakness or numbness in your arms or legs.  You develop nausea or vomiting.  You develop abdominal pain.  You feel faint. Document Released: 09/10/2005 Document Revised: 03/11/2012 Document Reviewed: 01/12/2014 [employer] Patient Information 2015 [employer]. This information is not intended to replace advice given to you by your health care provider. Make sure you  discuss any questions you have with your health care provider.  

## 2014-11-27 NOTE — ED Provider Notes (Signed)
CSN: [id number]     Arrival date & time 11/27/14  0409 History   First MD Initiated Contact with Patient 11/27/14 [phone]     Chief Complaint  Patient presents with  . Fall     (Consider location/radiation/quality/duration/timing/severity/associated sxs/prior Treatment) Patient is a 44 y.o. female presenting with back pain.  Back Pain Location:  Generalized Quality:  Aching Radiates to:  Does not radiate Pain severity:  Moderate Pain is:  Same all the time Onset quality:  Sudden Duration:  1 hour Timing:  Constant Progression:  Unchanged Chronicity:  New Context: falling (from shower, mechanical, with hit to head)   Relieved by:  Nothing Worsened by:  Nothing tried Ineffective treatments:  None tried Associated symptoms: no fever, no numbness and no paresthesias     Past Medical History  Diagnosis Date  . Hypertension   . Anemia   . Arthritis   . Fibromyalgia   . Depression   . History of blood transfusion 2004/2015  . Pneumonia   . URI (upper respiratory infection) 03/19/14    productive cough with yellow nasal discharge with fever- did not seek treatment- 7/17/15states resolved except occ cough now   Past Surgical History  Procedure Laterality Date  . Cholecystectomy  2004  . Dilation and curettage of uterus  2004  . Esophagogastroduodenoscopy  02/15/2012    Procedure: ESOPHAGOGASTRODUODENOSCOPY (EGD);  Surgeon: Theda BelfastPatrick D Hung, MD;  [facility];  Service: Endoscopy;  Laterality: N/A;  . Colonoscopy  02/15/2012    Procedure: COLONOSCOPY;  Surgeon: Theda BelfastPatrick D Hung, MD;  Location: [facility];  Service: Endoscopy;  Laterality: N/A;  . 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 No pertinent family history. History  Substance Use Topics  . Smoking status: Never Smoker   . Smokeless tobacco: Never Used  . Alcohol Use: No   OB History    No data available     Review of Systems  Constitutional: Negative for fever.  Musculoskeletal: Positive for back pain.  Neurological: Negative for numbness and paresthesias.  All other systems reviewed and are negative.     Allergies  Banana; Kiwi extract; and Pineapple  Home Medications   Prior to Admission medications   Medication Sig Start Date End Date Taking? Authorizing Provider  benzonatate (TESSALON) 100 MG capsule Take 2 capsules (200 mg total) by mouth 2 (two) times daily as needed for cough. 11/03/14   Hannah Muthersbaugh, PA-C  DULoxetine (CYMBALTA) 60 MG capsule Take 60 mg by mouth at bedtime.     Historical Provider, MD  fluticasone (FLONASE) 50 MCG/ACT nasal spray Place 2 sprays into both nostrils daily. 11/03/14   Hannah Muthersbaugh, PA-C  hydrochlorothiazide (MICROZIDE) 12.5 MG capsule Take 25 mg by mouth at bedtime.    Historical Provider, MD   BP 125/75 mmHg  Pulse 86  Temp(Src) 98.5 F (36.9 C) (Oral)  Resp 16  Wt 317 lb (143.79 kg)  SpO2 99%  LMP 10/25/2014 Physical Exam  Constitutional: She is oriented to person, place, and time. She appears well-developed and well-nourished.  HENT:  Head: Normocephalic and atraumatic.  Right Ear: External ear normal.  Left Ear: External ear normal.  Eyes: Conjunctivae and EOM are normal. Pupils are equal, round, and reactive to light.  Neck: Normal range of motion. Neck supple.  Cardiovascular: Normal rate, regular rhythm, normal heart sounds and intact distal pulses.   Pulmonary/Chest: Effort normal and breath sounds normal.  Abdominal: Soft. Bowel sounds are normal. There is no tenderness.  Musculoskeletal: Normal range of motion.       Cervical back: She exhibits tenderness (L  paraspinal) and bony tenderness.       Thoracic back: She exhibits tenderness and bony tenderness.       Lumbar back: She exhibits tenderness and bony tenderness.  Neurological: She is alert and oriented to person, place, and time. She has normal strength and normal reflexes. No cranial nerve deficit or sensory deficit.  Skin: Skin is warm and dry.  Vitals reviewed.   ED Course  Procedures (including critical care time) Labs Review Labs Reviewed  URINALYSIS, ROUTINE W REFLEX MICROSCOPIC - Abnormal; Notable for the following:    Leukocytes, UA SMALL (*)    All other components within normal limits  URINE MICROSCOPIC-ADD ON - Abnormal; Notable for the following:    Squamous Epithelial / LPF FEW (*)    Bacteria, UA FEW (*)    All other components within normal limits  PREGNANCY, URINE    Imaging Review Dg Thoracic Spine 2 View  11/27/2014   CLINICAL DATA:  Fall last night. Thoracic spine pain. Upper back pain.  EXAM: THORACIC SPINE - 2 VIEW  COMPARISON:  None.  FINDINGS: Exam degraded by obese body habitus. Cervical spondylosis is partially visible. The cervicothoracic junction appears normal. The visible thoracic vertebral bodies show normal height. Negative for fracture. Cholecystectomy clips are present in the right upper quadrant. There is a dextroconvex curve of the cervicothoracic spine on the frontal view. This is mild and may be positional. This curvature was not present on 01/15/2014 chest radiograph.  IMPRESSION: No acute osseous abnormality. Dextroconvex cervicothoracic curve is probably positional.   Electronically Signed   By: Andreas Newport M.D.   On: 11/27/2014 08:27   Dg Lumbar Spine Complete  11/27/2014   CLINICAL DATA:  Slipped and fell out of tub. Generalized body pain. Initial encounter.  EXAM: LUMBAR SPINE - COMPLETE 4+ VIEW  COMPARISON:  None.  FINDINGS: There is no evidence of lumbar spine fracture. Alignment is normal. Intervertebral disc spaces are maintained. Partly  visible left hip arthroplasty.  IMPRESSION: Negative.   Electronically Signed   By: Marnee Spring M.D.   On: 11/27/2014 07:32   Dg Ankle Complete Left  11/27/2014   CLINICAL DATA:  Fall at 7 p.m. last night. LEFT ankle pain. Initial encounter.  EXAM: LEFT ANKLE COMPLETE - 3+ VIEW  COMPARISON:  None.  FINDINGS: There is no evidence of fracture, dislocation, or joint effusion. There is no evidence of arthropathy or other focal bone abnormality. Soft tissues are unremarkable.  IMPRESSION: Negative.   Electronically Signed   By: Andreas Newport M.D.   On: 11/27/2014 08:32   Ct Head Wo Contrast  11/27/2014   CLINICAL DATA:  FALL LEFT-sided neck pain. Initial encounter. Fall from toilet this morning. Bilateral shoulder pain.  EXAM: CT HEAD WITHOUT CONTRAST  CT CERVICAL SPINE WITHOUT CONTRAST  TECHNIQUE: Multidetector CT imaging of the head and cervical spine was performed following the standard protocol without intravenous contrast. Multiplanar CT image reconstructions of the cervical spine were also generated.  COMPARISON:  None.  FINDINGS: CT HEAD FINDINGS  No mass lesion, mass effect,  midline shift, hydrocephalus, hemorrhage. No territorial ischemia or acute infarction. Proptosis. Mastoid air cells clear.  CT CERVICAL SPINE FINDINGS  Alignment: 2 mm retrolisthesis of C 2 on C3 appears degenerative and associated with collapse of the disc space. Mild dextroconvex cervicothoracic curvature.  Craniocervical junction: Occipital condyles intact. Odontoid intact. C1 ring is within normal limits.  Vertebrae: No aggressive osseous lesions. Degenerative endplate changes at C3-C4. Negative for fracture.  Central canal: No hematoma. Disc osteophyte complex at C3-C4 without bony central stenosis.  Paraspinal soft tissues: Study degraded by obese body habitus. Grossly within normal limits.  Lung apices: Visible portions clear.  IMPRESSION: 1. Negative CT head. 2. No cervical spine fracture or dislocation. Mild cervical  spondylosis.   Electronically Signed   By: Andreas Newport M.D.   On: 11/27/2014 08:04   Ct Cervical Spine Wo Contrast  11/27/2014   CLINICAL DATA:  FALL LEFT-sided neck pain. Initial encounter. Fall from toilet this morning. Bilateral shoulder pain.  EXAM: CT HEAD WITHOUT CONTRAST  CT CERVICAL SPINE WITHOUT CONTRAST  TECHNIQUE: Multidetector CT imaging of the head and cervical spine was performed following the standard protocol without intravenous contrast. Multiplanar CT image reconstructions of the cervical spine were also generated.  COMPARISON:  None.  FINDINGS: CT HEAD FINDINGS  No mass lesion, mass effect, midline shift, hydrocephalus, hemorrhage. No territorial ischemia or acute infarction. Proptosis. Mastoid air cells clear.  CT CERVICAL SPINE FINDINGS  Alignment: 2 mm retrolisthesis of C 2 on C3 appears degenerative and associated with collapse of the disc space. Mild dextroconvex cervicothoracic curvature.  Craniocervical junction: Occipital condyles intact. Odontoid intact. C1 ring is within normal limits.  Vertebrae: No aggressive osseous lesions. Degenerative endplate changes at C3-C4. Negative for fracture.  Central canal: No hematoma. Disc osteophyte complex at C3-C4 without bony central stenosis.  Paraspinal soft tissues: Study degraded by obese body habitus. Grossly within normal limits.  Lung apices: Visible portions clear.  IMPRESSION: 1. Negative CT head. 2. No cervical spine fracture or dislocation. Mild cervical spondylosis.   Electronically Signed   By: Andreas Newport M.D.   On: 11/27/2014 08:04   Dg Knee Complete 4 Views Right  11/27/2014   CLINICAL DATA:  Fall last night at 7 p.m.  RIGHT knee pain.  EXAM: RIGHT KNEE - COMPLETE 4+ VIEW  COMPARISON:  None.  FINDINGS: RIGHT total knee arthroplasty is present. There is lucency at the bone-cement interface along the lateral tibial tray suggesting early loosening. Negative for periprosthetic fracture. No effusion. The alignment of the knee  is anatomic.  IMPRESSION: No acute osseous abnormality. Probable early loosening at the lateral margin of the tibial tray.   Electronically Signed   By: Andreas Newport M.D.   On: 11/27/2014 08:29   Dg Femur, Min 2 Views Right  11/27/2014   CLINICAL DATA:  Fall at 7 p.m. last night. Initial encounter. RIGHT hip pain.  EXAM: RIGHT FEMUR 2 VIEWS  COMPARISON:  None.  FINDINGS: RIGHT hip joint space appears preserved. Negative for fracture. RIGHT knee arthroplasty. Exam degraded by obese body habitus.  IMPRESSION: Negative.   Electronically Signed   By: Andreas Newport M.D.   On: 11/27/2014 08:28     EKG Interpretation None      MDM   Final diagnoses:  Fall  Contusion  Back sprain, initial encounter    44 y.o. female with pertinent PMH of fibromyalgia presents with pain in neck, back, R hip and knee, and L ankle after mechanical fall with hit  to head.  Physical exam as above.    Wu unremarkable.  DC home with standard return precautions.  I have reviewed all laboratory and imaging studies if ordered as above  1. Contusion   2. Fall   3. Back sprain, initial encounter         Mirian Mo, MD 11/27/14 [phone]

## 2014-11-27 NOTE — ED Notes (Signed)
NAD, calm, no changes, [facility]

## 2014-12-28 IMAGING — CR DG HIP 1V PORT*L*
1 series · 1 of 1 positions shown · non-contrast
Comparison: None.

CLINICAL DATA: Status post left hip replacement

EXAM:
PORTABLE LEFT HIP - 1 VIEW

[AP]
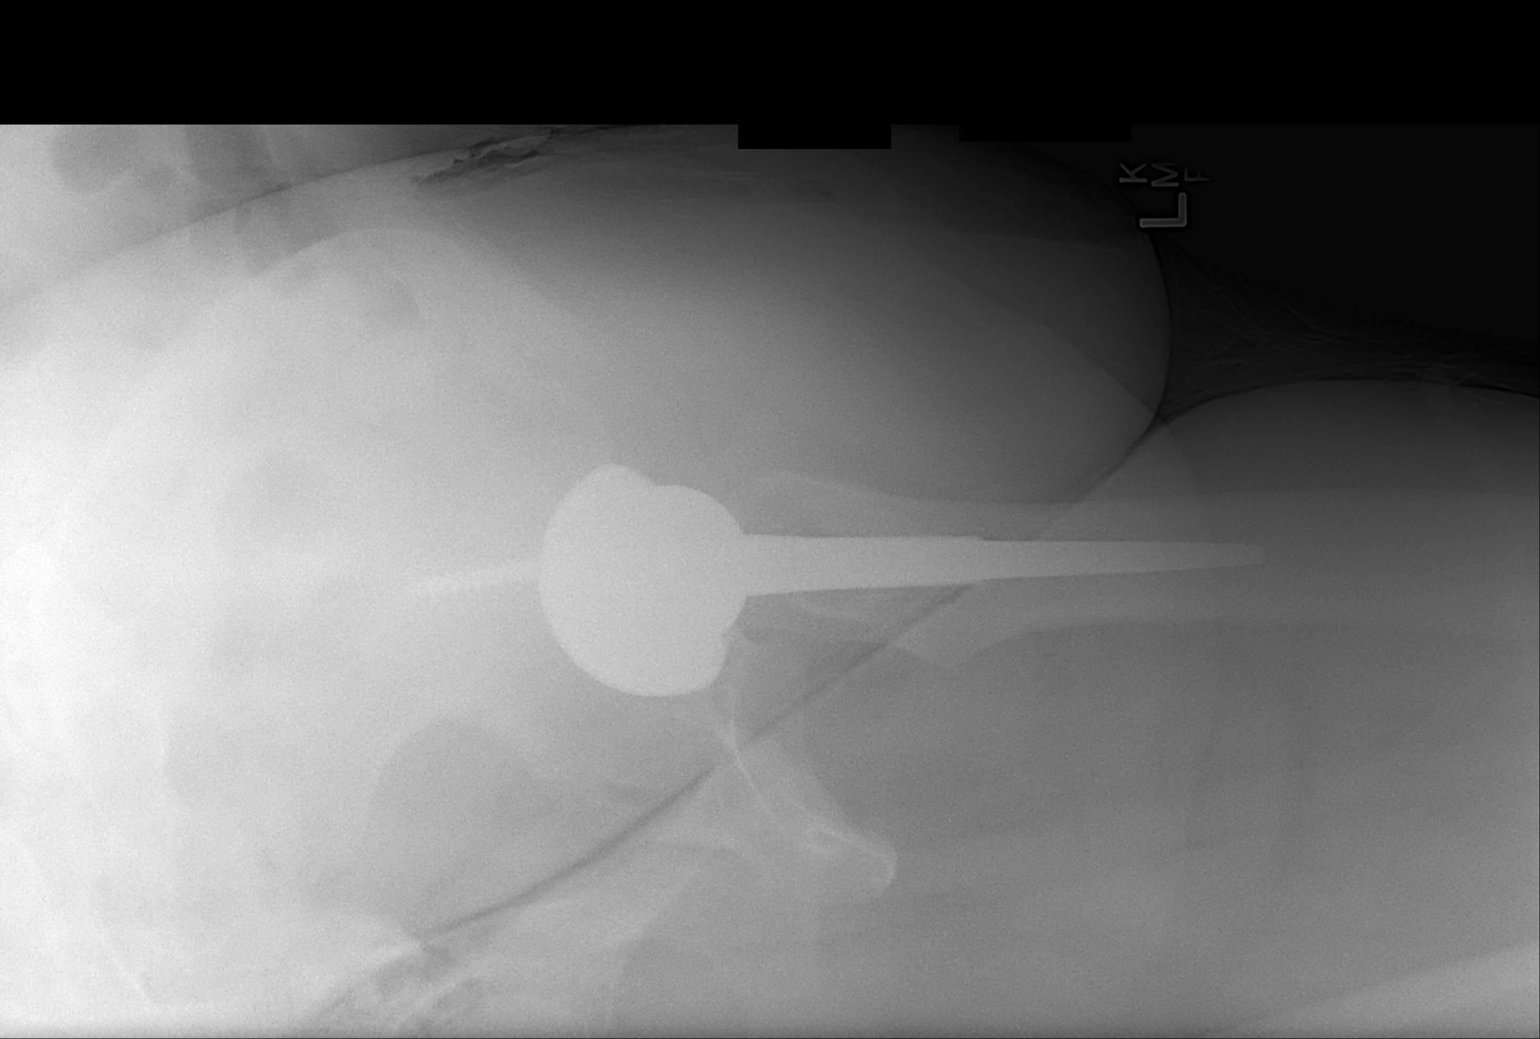

[1 of 1 positions shown; findings below may reference images not displayed]

FINDINGS: Lateral view of the left hip reveals a left hip prosthesis in
satisfactory position. No acute abnormality is noted.

## 2015-01-22 ENCOUNTER — Emergency Department (HOSPITAL_BASED_OUTPATIENT_CLINIC_OR_DEPARTMENT_OTHER)
Admission: EM | Admit: 2015-01-22 | Discharge: 2015-01-22 | Disposition: A | Discharge status: Home / Self Care | Payer: Medicare Other | Attending: Emergency Medicine | Admitting: Emergency Medicine

## 2015-01-22 ENCOUNTER — Encounter (HOSPITAL_BASED_OUTPATIENT_CLINIC_OR_DEPARTMENT_OTHER): Payer: Self-pay

## 2015-01-22 DIAGNOSIS — F329 Major depressive disorder, single episode, unspecified: Secondary | ICD-10-CM | POA: Diagnosis not present

## 2015-01-22 DIAGNOSIS — I10 Essential (primary) hypertension: Secondary | ICD-10-CM | POA: Diagnosis not present

## 2015-01-22 DIAGNOSIS — Z8701 Personal history of pneumonia (recurrent): Secondary | ICD-10-CM | POA: Insufficient documentation

## 2015-01-22 DIAGNOSIS — Z79899 Other long term (current) drug therapy: Secondary | ICD-10-CM | POA: Diagnosis not present

## 2015-01-22 DIAGNOSIS — M1990 Unspecified osteoarthritis, unspecified site: Secondary | ICD-10-CM | POA: Insufficient documentation

## 2015-01-22 DIAGNOSIS — H9201 Otalgia, right ear: Secondary | ICD-10-CM | POA: Diagnosis present

## 2015-01-22 DIAGNOSIS — Z7951 Long term (current) use of inhaled steroids: Secondary | ICD-10-CM | POA: Insufficient documentation

## 2015-01-22 DIAGNOSIS — H6091 Unspecified otitis externa, right ear: Secondary | ICD-10-CM | POA: Insufficient documentation

## 2015-01-22 DIAGNOSIS — Z8709 Personal history of other diseases of the respiratory system: Secondary | ICD-10-CM | POA: Diagnosis not present

## 2015-01-22 DIAGNOSIS — M797 Fibromyalgia: Secondary | ICD-10-CM | POA: Insufficient documentation

## 2015-01-22 DIAGNOSIS — Z862 Personal history of diseases of the blood and blood-forming organs and certain disorders involving the immune mechanism: Secondary | ICD-10-CM | POA: Diagnosis not present

## 2015-01-22 MED ORDER — NEOMYCIN-POLYMYXIN-HC 3.5-10000-1 OT SUSP: 3.0000 [drp] | Freq: Three times a day (TID) | OTIC | Status: DC

## 2015-01-22 MED ORDER — CEPHALEXIN 250 MG PO CAPS
1000.0000 mg | ORAL_CAPSULE | Freq: Once | ORAL | Status: AC
  Administered 2015-01-22: 1000 mg via ORAL
  Filled 2015-01-22: qty 4

## 2015-01-22 MED ORDER — CEPHALEXIN 500 MG PO CAPS: 500.0000 mg | ORAL_CAPSULE | Freq: Four times a day (QID) | ORAL | Status: DC

## 2015-01-22 NOTE — ED Provider Notes (Signed)
CSN: [id number]     Arrival date & time 01/22/15  0012 History   First MD Initiated Contact with Patient 01/22/15 0321     Chief Complaint  Patient presents with  . Otalgia     (Consider location/radiation/quality/duration/timing/severity/associated sxs/prior Treatment) Patient is a 44 y.o. female presenting with ear pain. The history is provided by the patient.  Otalgia She has noticed a painful knot on her right ear as well as a sense of swelling in decreased hearing. Pain sometimes shoots into her face. Pain is moderate at 4/10 and less somebody touches her ear. She denies fever or chills. There's been no drainage from the ear. She denies any trauma.  Past Medical History  Diagnosis Date  . Hypertension   . Anemia   . Arthritis   . Fibromyalgia   . Depression   . History of blood transfusion 2004/2015  . Pneumonia   . URI (upper respiratory infection) 03/19/14    productive cough with yellow nasal discharge with fever- did not seek treatment- 7/17/15states resolved except occ cough now   Past Surgical History  Procedure Laterality Date  . Cholecystectomy  2004  . Dilation and curettage of uterus  2004  . Esophagogastroduodenoscopy  02/15/2012    Procedure: ESOPHAGOGASTRODUODENOSCOPY (EGD);  Surgeon: Theda Belfast, MD;  Location: [facility];  Service: Endoscopy;  Laterality: N/A;  . Colonoscopy  02/15/2012    Procedure: COLONOSCOPY;  Surgeon: Theda Belfast, MD;  Location: [facility];  Service: Endoscopy;  Laterality: N/A;  . Total hip arthroplasty Left 01/26/2014    Procedure: LEFT TOTAL HIP ARTHROPLASTY ANTERIOR APPROACH;  Surgeon: [name], MD;  Location: [facility];  Service: Orthopedics;  Laterality: Left;  . Total knee arthroplasty Right 04/13/2014    Procedure: RIGHT TOTAL KNEE ARTHROPLASTY;  Surgeon: [name], MD;  Location: [facility];  Service: Orthopedics;  Laterality: Right;   No family history on file. History  Substance Use Topics  . Smoking status: Never  Smoker   . Smokeless tobacco: Never Used  . Alcohol Use: No   OB History    No data available     Review of Systems  HENT: Positive for ear pain.   All other systems reviewed and are negative.     Allergies  Banana; Kiwi extract; and Pineapple  Home Medications   Prior to Admission medications   Medication Sig Start Date End Date Taking? Authorizing Provider  benzonatate (TESSALON) 100 MG capsule Take 2 capsules (200 mg total) by mouth 2 (two) times daily as needed for cough. 11/03/14   Hannah Muthersbaugh, PA-C  cephALEXin (KEFLEX) 500 MG capsule Take 1 capsule (500 mg total) by mouth 4 (four) times daily. 01/22/15   Dione Booze, MD  DULoxetine (CYMBALTA) 60 MG capsule Take 60 mg by mouth at bedtime.     Historical Provider, MD  fluticasone (FLONASE) 50 MCG/ACT nasal spray Place 2 sprays into both nostrils daily. 11/03/14   Hannah Muthersbaugh, PA-C  hydrochlorothiazide (MICROZIDE) 12.5 MG capsule Take 25 mg by mouth at bedtime.    Historical Provider, MD  neomycin-polymyxin-hydrocortisone (CORTISPORIN) 3.5-10000-1 otic suspension Place 3 drops into both ears 3 (three) times daily. X 7 days 01/22/15   Dione Booze, MD   BP 154/83 mmHg  Pulse 98  Temp(Src) 98.1 F (36.7 C) (Oral)  Resp 16  Ht  (1.626 m)  Wt 316 lb (143.337 kg)  BMI 54.21 kg/m2  SpO2 97%  LMP 01/10/2015 Physical Exam  Nursing note and vitals  reviewed.  Morbidly obese 44 year old female, resting comfortably and in no acute distress. Vital signs are significant for hypertension. Oxygen saturation is 97%, which is normal. Head is normocephalic and atraumatic. PERRLA, EOMI. Oropharynx is clear. There is tenderness to palpation of the tragus of the right ear. External canal is swollen and is tender but there is no drainage seen. No palpable pre-or postauricular lymph node. Neck is nontender and supple without adenopathy or JVD. Back is nontender and there is no CVA tenderness. Lungs are clear without rales,  wheezes, or rhonchi. Chest is nontender. Heart has regular rate and rhythm without murmur. Abdomen is soft, flat, nontender without masses or hepatosplenomegaly and peristalsis is normoactive. Extremities have no cyanosis or edema, full range of motion is present. Skin is warm and dry without rash. Neurologic: Mental status is normal, cranial nerves are intact, there are no motor or sensory deficits.  ED Course  Procedures (including critical care time)   MDM   Final diagnoses:  Otitis externa of right ear    Otitis externa. She is discharged with prescription for Cortisporin otic suspension and cephalexin and is referred to ENT for follow-up.    Dione Boozeavid Daimen Shovlin, MD 01/22/15 [id number]

## 2015-01-22 NOTE — Discharge Instructions (Signed)
Otitis Externa Otitis externa is a bacterial or fungal infection of the outer ear canal. This is the area from the eardrum to the outside of the ear. Otitis externa is sometimes called "swimmer's ear." CAUSES  Possible causes of infection include:  Swimming in dirty water.  Moisture remaining in the ear after swimming or bathing.  Mild injury (trauma) to the ear.  Objects stuck in the ear (foreign body).  Cuts or scrapes (abrasions) on the outside of the ear. SIGNS AND SYMPTOMS  The first symptom of infection is often itching in the ear canal. Later signs and symptoms may include swelling and redness of the ear canal, ear pain, and yellowish-white fluid (pus) coming from the ear. The ear pain may be worse when pulling on the earlobe. DIAGNOSIS  Your health care provider will perform a physical exam. A sample of fluid may be taken from the ear and examined for bacteria or fungi. TREATMENT  Antibiotic ear drops are often given for 10 to 14 days. Treatment may also include pain medicine or corticosteroids to reduce itching and swelling. HOME CARE INSTRUCTIONS   Apply antibiotic ear drops to the ear canal as prescribed by your health care provider.  Take medicines only as directed by your health care provider.  If you have diabetes, follow any additional treatment instructions from your health care provider.  Keep all follow-up visits as directed by your health care provider. PREVENTION   Keep your ear dry. Use the corner of a towel to absorb water out of the ear canal after swimming or bathing.  Avoid scratching or putting objects inside your ear. This can damage the ear canal or remove the protective wax that lines the canal. This makes it easier for bacteria and fungi to grow.  Avoid swimming in lakes, polluted water, or poorly chlorinated pools.  You may use ear drops made of rubbing alcohol and vinegar after swimming. Combine equal parts of white vinegar and alcohol in a bottle.  Put 3 or 4 drops into each ear after swimming. SEEK MEDICAL CARE IF:   You have a fever.  Your ear is still red, swollen, painful, or draining pus after 3 days.  Your redness, swelling, or pain gets worse.  You have a severe headache.  You have redness, swelling, pain, or tenderness in the area behind your ear. MAKE SURE YOU:   Understand these instructions.  Will watch your condition.  Will get help right away if you are not doing well or get worse. Document Released: 09/10/2005 Document Revised: 01/25/2014 Document Reviewed: 09/27/2011 [facility] Patient Information 2015 [place], [place]. This information is not intended to replace advice given to you by your health care provider. Make sure you discuss any questions you have with your health care provider.  Hydrocortisone; Neomycin; Polymyxin B ear suspension What is this medicine? HYDROCORTISONE; NEOMYCIN; and POLYMYXIN B (hye droe KOR ti sone; nee oh MYE sin; pol i MIX in B) is used to treat ear infections. This medicine may be used for other purposes; ask your health care provider or pharmacist if you have questions. COMMON BRAND NAME(S): AK-Spore HC, AK-Spore HC Otic, Antibiotic Otic, Aural, Cortisporin, Cortomycin, Duomycin-HC, Oti-Sone, Oticin HC, Otimar, Pediotic, Uad What should I tell my health care provider before I take this medicine? They need to know if you have any of these conditions: -any other active infections -chronic ear infections or fluid in the ear -perforated ear drum -an unusual or allergic reaction to hydrocortisone, neomycin, polymyxin B, sulfites, other  medicines, foods, dyes, or preservatives -pregnant or trying to get pregnant -breast-feeding How should I use this medicine? This medicine is only for use in the ears. Wash your hands with soap and water. Clean your ear of any fluid that can be easily removed. Do not insert any object or swab into the ear canal. Gently warm the bottle by holding it in the  hand for 1 to 2 minutes. Lie down on your side with the infected ear up. Try not to touch the tip of the dropper to your ear, fingertips, or other surface. Shake the bottle immediately before using. Squeeze the bottle gently to put the prescribed number of drops in the ear canal. Stay in this position for 30 to 60 seconds to help the drops soak into the ear. Repeat the steps for the other ear if both ears are infected. Do not use your medicine more often than directed. Finish the full course of medicine prescribed by your doctor or health care professional even if you think your condition is better. Talk to your pediatrician regarding the use of this medicine in children. While this drug may be prescribed for selected conditions, precautions do apply. Overdosage: If you think you have taken too much of this medicine contact a poison control center or emergency room at once. NOTE: This medicine is only for you. Do not share this medicine with others. What if I miss a dose? If you miss a dose, use it as soon as you can. If it is almost time for your next dose, use only that dose. Do not take double or extra doses. What may interact with this medicine? Interactions are not expected. Do not use other ear products without talking to your doctor or health care professional. This list may not describe all possible interactions. Give your health care provider a list of all the medicines, herbs, non-prescription drugs, or dietary supplements you use. Also tell them if you smoke, drink alcohol, or use illegal drugs. Some items may interact with your medicine. What should I watch for while using this medicine? Tell your doctor or health care professional if your ear infection does not get better in a few days. Do not use longer than 10 days unless instructed by your doctor or health care professional. If rash or allergic reaction occurs, stop the product immediately and contact your physician. It is important that  you keep the infected ear(s) clean and dry. When bathing, try not to get the infected ear(s) wet. Do not go swimming unless your doctor or health care professional has told you otherwise. To prevent the spread of infection, do not share ear products, or share towels and washcloths with anyone else. What side effects may I notice from receiving this medicine? Side effects that you should report to your doctor or health care professional as soon as possible: -rash -red, itchy, dry scaly skin at the affected site -worsening ear pain Side effects that usually do not require medical attention (report to your doctor or health care professional if they continue or are bothersome): -abnormal sensation in the ear -burning or stinging while putting the drops in the ear This list may not describe all possible side effects. Call your doctor for medical advice about side effects. You may report side effects to FDA at 1-800-FDA-1088. Where should I keep my medicine? Keep out of the reach of children. Store at room temperature between 15 and 25 degrees C (59 and 77 degrees F). Do not freeze. Throw  away any unused medicine after the expiration date. NOTE: This sheet is a summary. It may not cover all possible information. If you have questions about this medicine, talk to your doctor, pharmacist, or health care provider.  2015, Elsevier/Gold Standard. (2008-01-23 15:49:00)  Cephalexin tablets or capsules What is this medicine? CEPHALEXIN (sef a LEX in) is a cephalosporin antibiotic. It is used to treat certain kinds of bacterial infections It will not work for colds, flu, or other viral infections. This medicine may be used for other purposes; ask your health care provider or pharmacist if you have questions. COMMON BRAND NAME(S): Biocef, Keflex, Keftab What should I tell my health care provider before I take this medicine? They need to know if you have any of these conditions: -kidney disease -stomach or  intestine problems, especially colitis -an unusual or allergic reaction to cephalexin, other cephalosporins, penicillins, other antibiotics, medicines, foods, dyes or preservatives -pregnant or trying to get pregnant -breast-feeding How should I use this medicine? Take this medicine by mouth with a full glass of water. Follow the directions on the prescription label. This medicine can be taken with or without food. Take your medicine at regular intervals. Do not take your medicine more often than directed. Take all of your medicine as directed even if you think you are better. Do not skip doses or stop your medicine early. Talk to your pediatrician regarding the use of this medicine in children. While this drug may be prescribed for selected conditions, precautions do apply. Overdosage: If you think you have taken too much of this medicine contact a poison control center or emergency room at once. NOTE: This medicine is only for you. Do not share this medicine with others. What if I miss a dose? If you miss a dose, take it as soon as you can. If it is almost time for your next dose, take only that dose. Do not take double or extra doses. There should be at least 4 to 6 hours between doses. What may interact with this medicine? -probenecid -some other antibiotics This list may not describe all possible interactions. Give your health care provider a list of all the medicines, herbs, non-prescription drugs, or dietary supplements you use. Also tell them if you smoke, drink alcohol, or use illegal drugs. Some items may interact with your medicine. What should I watch for while using this medicine? Tell your doctor or health care professional if your symptoms do not begin to improve in a few days. Do not treat diarrhea with over the counter products. Contact your doctor if you have diarrhea that lasts more than 2 days or if it is severe and watery. If you have diabetes, you may get a false-positive  result for sugar in your urine. Check with your doctor or health care professional. What side effects may I notice from receiving this medicine? Side effects that you should report to your doctor or health care professional as soon as possible: -allergic reactions like skin rash, itching or hives, swelling of the face, lips, or tongue -breathing problems -pain or trouble passing urine -redness, blistering, peeling or loosening of the skin, including inside the mouth -severe or watery diarrhea -unusually weak or tired -yellowing of the eyes, skin Side effects that usually do not require medical attention (report to your doctor or health care professional if they continue or are bothersome): -gas or heartburn -genital or anal irritation -headache -joint or muscle pain -nausea, vomiting This list may not describe all possible side  effects. Call your doctor for medical advice about side effects. You may report side effects to FDA at 1-800-FDA-1088. Where should I keep my medicine? Keep out of the reach of children. Store at room temperature between 59 and 86 degrees F (15 and 30 degrees C). Throw away any unused medicine after the expiration date. NOTE: This sheet is a summary. It may not cover all possible information. If you have questions about this medicine, talk to your doctor, pharmacist, or health care provider.  2015, Elsevier/Gold Standard. (2007-12-15 17:09:13)

## 2015-01-22 NOTE — ED Notes (Signed)
Pt c/o a knot in rt ear, c/o pain shooting up to side of face x2 days, c/o decrease hearing in that ear

## 2015-11-04 ENCOUNTER — Emergency Department (HOSPITAL_COMMUNITY): Payer: No Typology Code available for payment source

## 2015-11-04 ENCOUNTER — Encounter (HOSPITAL_COMMUNITY): Payer: Self-pay

## 2015-11-04 ENCOUNTER — Emergency Department (HOSPITAL_COMMUNITY)
Admission: EM | Admit: 2015-11-04 | Discharge: 2015-11-04 | Disposition: A | Discharge status: Home / Self Care | Payer: No Typology Code available for payment source | Attending: Emergency Medicine | Admitting: Emergency Medicine

## 2015-11-04 DIAGNOSIS — M1990 Unspecified osteoarthritis, unspecified site: Secondary | ICD-10-CM | POA: Insufficient documentation

## 2015-11-04 DIAGNOSIS — F329 Major depressive disorder, single episode, unspecified: Secondary | ICD-10-CM | POA: Diagnosis not present

## 2015-11-04 DIAGNOSIS — Z8709 Personal history of other diseases of the respiratory system: Secondary | ICD-10-CM | POA: Insufficient documentation

## 2015-11-04 DIAGNOSIS — S6991XA Unspecified injury of right wrist, hand and finger(s), initial encounter: Secondary | ICD-10-CM | POA: Diagnosis not present

## 2015-11-04 DIAGNOSIS — Y998 Other external cause status: Secondary | ICD-10-CM | POA: Diagnosis not present

## 2015-11-04 DIAGNOSIS — S4991XA Unspecified injury of right shoulder and upper arm, initial encounter: Secondary | ICD-10-CM | POA: Insufficient documentation

## 2015-11-04 DIAGNOSIS — D649 Anemia, unspecified: Secondary | ICD-10-CM | POA: Insufficient documentation

## 2015-11-04 DIAGNOSIS — M797 Fibromyalgia: Secondary | ICD-10-CM | POA: Diagnosis not present

## 2015-11-04 DIAGNOSIS — Y92410 Unspecified street and highway as the place of occurrence of the external cause: Secondary | ICD-10-CM | POA: Diagnosis not present

## 2015-11-04 DIAGNOSIS — Z8701 Personal history of pneumonia (recurrent): Secondary | ICD-10-CM | POA: Diagnosis not present

## 2015-11-04 DIAGNOSIS — Y9389 Activity, other specified: Secondary | ICD-10-CM | POA: Insufficient documentation

## 2015-11-04 DIAGNOSIS — M7918 Myalgia, other site: Secondary | ICD-10-CM

## 2015-11-04 DIAGNOSIS — I10 Essential (primary) hypertension: Secondary | ICD-10-CM | POA: Diagnosis not present

## 2015-11-04 DIAGNOSIS — Z79899 Other long term (current) drug therapy: Secondary | ICD-10-CM | POA: Insufficient documentation

## 2015-11-04 MED ORDER — NAPROXEN 500 MG PO TABS: 500.0000 mg | ORAL_TABLET | Freq: Two times a day (BID) | ORAL | Status: DC

## 2015-11-04 MED ORDER — OXYCODONE-ACETAMINOPHEN 5-325 MG PO TABS: 2.0000 | ORAL_TABLET | ORAL | Status: DC | PRN

## 2015-11-04 MED ORDER — METHOCARBAMOL 500 MG PO TABS: 500.0000 mg | ORAL_TABLET | Freq: Two times a day (BID) | ORAL | Status: DC

## 2015-11-04 MED ORDER — METHOCARBAMOL 500 MG PO TABS
500.0000 mg | ORAL_TABLET | Freq: Once | ORAL | Status: AC
  Administered 2015-11-04: 500 mg via ORAL
  Filled 2015-11-04: qty 1

## 2015-11-04 MED ORDER — OXYCODONE-ACETAMINOPHEN 5-325 MG PO TABS
1.0000 | ORAL_TABLET | Freq: Once | ORAL | Status: AC
  Administered 2015-11-04: 1 via ORAL
  Filled 2015-11-04: qty 1

## 2015-11-04 MED ORDER — NAPROXEN 500 MG PO TABS
500.0000 mg | ORAL_TABLET | Freq: Once | ORAL | Status: AC
  Administered 2015-11-04: 500 mg via ORAL
  Filled 2015-11-04: qty 1
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
## 2015-11-04 NOTE — ED Notes (Signed)
Patient also c/o right shoulder pain. Worse with movement.

## 2016-09-03 ENCOUNTER — Encounter (HOSPITAL_BASED_OUTPATIENT_CLINIC_OR_DEPARTMENT_OTHER): Payer: Self-pay | Admitting: *Deleted

## 2016-09-03 ENCOUNTER — Emergency Department (HOSPITAL_BASED_OUTPATIENT_CLINIC_OR_DEPARTMENT_OTHER)
Admission: EM | Admit: 2016-09-03 | Discharge: 2016-09-03 | Disposition: A | Discharge status: Home / Self Care | Payer: Medicare Other | Attending: Emergency Medicine | Admitting: Emergency Medicine

## 2016-09-03 ENCOUNTER — Emergency Department (HOSPITAL_BASED_OUTPATIENT_CLINIC_OR_DEPARTMENT_OTHER): Payer: Medicare Other

## 2016-09-03 DIAGNOSIS — R1084 Generalized abdominal pain: Secondary | ICD-10-CM

## 2016-09-03 DIAGNOSIS — I10 Essential (primary) hypertension: Secondary | ICD-10-CM | POA: Diagnosis not present

## 2016-09-03 DIAGNOSIS — R109 Unspecified abdominal pain: Secondary | ICD-10-CM | POA: Diagnosis present

## 2016-09-03 DIAGNOSIS — R1110 Vomiting, unspecified: Secondary | ICD-10-CM | POA: Insufficient documentation

## 2016-09-03 DIAGNOSIS — Z79899 Other long term (current) drug therapy: Secondary | ICD-10-CM | POA: Diagnosis not present

## 2016-09-03 LAB — CBC WITH DIFFERENTIAL/PLATELET
BASOS PCT: 1 %
Basophils Absolute: 0.1 10*3/uL (ref 0.0–0.1)
EOS PCT: 23 %
Eosinophils Absolute: 1.7 10*3/uL — ABNORMAL HIGH (ref 0.0–0.7)
HEMATOCRIT: 39.1 % (ref 36.0–46.0)
Hemoglobin: 12.8 g/dL (ref 12.0–15.0)
LYMPHS PCT: 34 %
Lymphs Abs: 2.6 10*3/uL (ref 0.7–4.0)
MCH: 29.6 pg (ref 26.0–34.0)
MCHC: 32.7 g/dL (ref 30.0–36.0)
MCV: 90.3 fL (ref 78.0–100.0)
Monocytes Absolute: 0.5 10*3/uL (ref 0.1–1.0)
Monocytes Relative: 6 %
NEUTROS PCT: 36 %
Neutro Abs: 2.7 10*3/uL (ref 1.7–7.7)
Platelets: 270 10*3/uL (ref 150–400)
RBC: 4.33 MIL/uL (ref 3.87–5.11)
RDW: 13.8 % (ref 11.5–15.5)
WBC: 7.6 10*3/uL (ref 4.0–10.5)

## 2016-09-03 LAB — URINALYSIS, ROUTINE W REFLEX MICROSCOPIC
Bilirubin Urine: NEGATIVE
Glucose, UA: NEGATIVE mg/dL
Hgb urine dipstick: NEGATIVE
Ketones, ur: NEGATIVE mg/dL
LEUKOCYTES UA: NEGATIVE
NITRITE: NEGATIVE
PH: 6.5 (ref 5.0–8.0)
Protein, ur: NEGATIVE mg/dL
SPECIFIC GRAVITY, URINE: 1.004 — AB (ref 1.005–1.030)

## 2016-09-03 LAB — BASIC METABOLIC PANEL
ANION GAP: 7 (ref 5–15)
BUN: 10 mg/dL (ref 6–20)
CO2: 25 mmol/L (ref 22–32)
Calcium: 8.9 mg/dL (ref 8.9–10.3)
Chloride: 105 mmol/L (ref 101–111)
Creatinine, Ser: 0.66 mg/dL (ref 0.44–1.00)
GFR calc Af Amer: >60 mL/min (ref >60)
GLUCOSE: 106 mg/dL — AB (ref 65–99)
Potassium: 3.8 mmol/L (ref 3.5–5.1)
Sodium: 137 mmol/L (ref 135–145)

## 2016-09-03 LAB — PREGNANCY, URINE: Preg Test, Ur: NEGATIVE

## 2016-09-03 MED ORDER — ONDANSETRON HCL 4 MG/2ML IJ SOLN: 4.0000 mg | Freq: Once | INTRAMUSCULAR | Status: DC

## 2016-09-03 MED ORDER — MORPHINE SULFATE (PF) 4 MG/ML IV SOLN: 4.0000 mg | Freq: Once | INTRAVENOUS | Status: DC

## 2016-09-03 MED ORDER — IOPAMIDOL (ISOVUE-300) INJECTION 61%
100.0000 mL | Freq: Once | INTRAVENOUS | Status: AC | PRN
  Administered 2016-09-03: 100 mL via INTRAVENOUS

## 2016-09-03 NOTE — ED Provider Notes (Signed)
[facility]   CSN: [id number] Arrival date & time: 09/03/16  1816 By signing my name below, I, Linus Galas, attest that this documentation has been prepared under the direction and in the presence of Roxy Horseman. Electronically Signed: Linus Galas, ED Scribe. 09/03/16. 8:23 PM.   History   Chief Complaint Chief Complaint  Patient presents with  . Abdominal Pain   The history is provided by the patient. No language interpreter was used.   HPI Comments: Megan Robles is a 45 y.o. female who presents to the Emergency Department with a PMHx of fibromyalgia and HTN complaining of right sided abdominal pain that began 3 days ago. Pt also reports emesis 4 days ago. Pt states her symptoms have been improving since onset. Her pain is aggravated with movement. Pt denies fevers, chills, CP, SOB, dysuria, vaginal bleeding, vaginal discharge, or any other symptoms at this time. Last oral intake prior to arrival.   Past Medical History:  Diagnosis Date  . Anemia   . Arthritis   . Depression   . Fibromyalgia   . History of blood transfusion 2004/2015  . Hypertension   . Pneumonia   . URI (upper respiratory infection) 03/19/14   productive cough with yellow nasal discharge with fever- did not seek treatment- 7/17/15states resolved except occ cough now    Patient Active Problem List   Diagnosis Date Noted  . UTI (urinary tract infection) 04/16/2014  . 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 Expected blood loss anemia 01/28/2014  . Morbid obesity (HCC) 01/28/2014  . S/P total hip arthroplasty 01/26/2014    Past Surgical History:  Procedure Laterality Date  . ABDOMINAL HYSTERECTOMY    . CHOLECYSTECTOMY  2004  . COLONOSCOPY  02/15/2012   Procedure: COLONOSCOPY;  Surgeon: Theda Belfast, MD;  Location: [facility];  Service: Endoscopy;  Laterality: N/A;  . DILATION AND CURETTAGE OF UTERUS  2004  . ESOPHAGOGASTRODUODENOSCOPY  02/15/2012   Procedure:  ESOPHAGOGASTRODUODENOSCOPY (EGD);  Surgeon: Theda Belfast, MD;  Location: [facility];  Service: Endoscopy;  Laterality: N/A;  . TOTAL HIP ARTHROPLASTY Left 01/26/2014   Procedure: LEFT TOTAL HIP ARTHROPLASTY ANTERIOR APPROACH;  Surgeon: [name], MD;  Location: [facility];  Service: Orthopedics;  Laterality: Left;  . TOTAL KNEE ARTHROPLASTY Right 04/13/2014   Procedure: RIGHT TOTAL KNEE ARTHROPLASTY;  Surgeon: [name], MD;  Location: [facility];  Service: Orthopedics;  Laterality: Right;    OB History    No data available       Home Medications    Prior to Admission medications   Medication Sig Start Date End Date Taking? Authorizing Provider  lisinopril (PRINIVIL,ZESTRIL) 10 MG tablet Take 10 mg by mouth daily.   Yes Historical Provider, MD  diclofenac sodium (VOLTAREN) 1 % GEL Apply 4 g topically 2 (two) times daily as needed (pain).    Historical Provider, MD  DULoxetine (CYMBALTA) 60 MG capsule Take 60 mg by mouth at bedtime.     Historical Provider, MD  ferrous sulfate 325 (65 FE) MG tablet Take 352 mg by mouth 3 (three) times daily. 08/09/15   Historical Provider, MD  fluticasone (FLONASE) 50 MCG/ACT nasal spray Place 2 sprays into both nostrils daily. Patient taking differently: Place 2 sprays into both nostrils daily as needed for allergies or rhinitis.  11/03/14   Hannah Muthersbaugh, PA-C  hydrochlorothiazide (HYDRODIURIL) 25 MG tablet Take 25 mg by mouth daily.    Historical Provider, MD  methocarbamol (ROBAXIN) 500 MG  tablet Take 1 tablet (500 mg total) by mouth 2 (two) times daily. 11/04/15   Hanna Patel-Mills, PA-C  naproxen (NAPROSYN) 500 MG tablet Take 1 tablet (500 mg total) by mouth 2 (two) times daily. 11/04/15   Hanna Patel-Mills, PA-C  naproxen sodium (ANAPROX) 220 MG tablet Take 220 mg by mouth every 12 (twelve) hours as needed (pain).    Historical Provider, MD  oxyCODONE-acetaminophen (PERCOCET/ROXICET) 5-325 MG tablet Take 2 tablets by mouth every 4 (four) hours  as needed for severe pain. 11/04/15   Catha GosselinHanna Patel-Mills, PA-C    Family History Family History  Problem Relation Age of Onset  . COPD Mother     Social History Social History  Substance Use Topics  . Smoking status: Never Smoker  . Smokeless tobacco: Never Used  . Alcohol use No     Allergies   Meperidine; Banana; Kiwi extract; and Pineapple   Review of Systems Review of Systems  Constitutional: Negative for chills and fever.  Respiratory: Negative for shortness of breath.   Cardiovascular: Negative for chest pain.  Gastrointestinal: Positive for abdominal pain and vomiting.  Genitourinary: Negative for dysuria, vaginal bleeding and vaginal discharge.   Physical Exam Updated Vital Signs BP 106/65   Pulse 79   Temp 98.2 F (36.8 C)   Resp 18   Ht 5\' 4"  (1.626 m)   Wt 250 lb (113.4 kg)   LMP 01/10/2015   SpO2 98%   BMI 42.91 kg/m   Physical Exam  Constitutional: She is oriented to person, place, and time. She appears well-developed and well-nourished.  HENT:  Head: Normocephalic and atraumatic.  Eyes: Conjunctivae and EOM are normal. Pupils are equal, round, and reactive to light.  Neck: Normal range of motion. Neck supple.  Cardiovascular: Normal rate and regular rhythm.  Exam reveals no gallop and no friction rub.   No murmur heard. Pulmonary/Chest: Effort normal and breath sounds normal. No respiratory distress. She has no wheezes. She has no rales. She exhibits no tenderness.  Abdominal: Soft. Bowel sounds are normal. She exhibits no distension and no mass. There is no tenderness. There is no rebound and no guarding.  Moderate RLQ tenderness to palpation, no other focal abdominal tenderness.   Musculoskeletal: Normal range of motion. She exhibits no edema or tenderness.  Neurological: She is alert and oriented to person, place, and time.  Skin: Skin is warm and dry.  Psychiatric: She has a normal mood and affect. Her behavior is normal. Judgment and thought  content normal.  Nursing note and vitals reviewed.  ED Treatments / Results  DIAGNOSTIC STUDIES: Oxygen Saturation is 98% on room air, normal by my interpretation.    COORDINATION OF CARE: 8:25 PM Discussed treatment plan with pt at bedside and pt agreed to plan.  Labs (all labs ordered are listed, but only abnormal results are displayed) Labs Reviewed  URINALYSIS, ROUTINE W REFLEX MICROSCOPIC - Abnormal; Notable for the following:       Result Value   APPearance CLOUDY (*)    Specific Gravity, Urine 1.004 (*)    All other components within normal limits  CBC WITH DIFFERENTIAL/PLATELET - Abnormal; Notable for the following:    Eosinophils Absolute 1.7 (*)    All other components within normal limits  BASIC METABOLIC PANEL - Abnormal; Notable for the following:    Glucose, Bld 106 (*)    All other components within normal limits  PREGNANCY, URINE    EKG  EKG Interpretation None  Radiology Ct Abdomen Pelvis W Contrast  Result Date: 09/03/2016 CLINICAL DATA:  Right lower quadrant pain. Nausea, vomiting and diarrhea. EXAM: CT ABDOMEN AND PELVIS WITH CONTRAST TECHNIQUE: Multidetector CT imaging of the abdomen and pelvis was performed using the standard protocol following bolus administration of intravenous contrast. CONTRAST:  100mL ISOVUE-300 IOPAMIDOL (ISOVUE-300) INJECTION 61% COMPARISON:  CT abdomen pelvis 03/05/2016 FINDINGS: Lower chest: No pulmonary nodules. No visible pleural or pericardial effusion. Hepatobiliary: Hemangioma in the right hepatic lobe is unchanged. Subcentimeter hypodense lesion in the more medial right hepatic lobe may also be a hemangioma, but is too small to characterize accurately. Status post cholecystectomy. Pancreas: Normal pancreatic contours and enhancement. No peripancreatic fluid collection or pancreatic ductal dilatation. Spleen: Normal. Adrenals/Urinary Tract: Normal adrenal glands. No hydronephrosis or solid renal mass. Stomach/Bowel:  Status post gastric sleeve procedure. No abnormal bowel dilatation. No bowel wall thickening or adjacent fat stranding to indicate acute inflammation. No abdominal fluid collection. Normal appendix. Vascular/Lymphatic: Normal course and caliber of the major abdominal vessels. No abdominal or pelvic adenopathy. Reproductive: Status post hysterectomy.  No adnexal mass. Musculoskeletal: Left hip arthroplasty. No bony spinal canal stenosis. Normal visualized extrathoracic and extraperitoneal soft tissues. Other: No contributory non-categorized findings. IMPRESSION: 1. No acute abnormality of the abdomen or pelvis. 2. Unchanged right hepatic lobe hemangioma. Electronically Signed   By: Deatra RobinsonKevin  Herman M.D.   On: 09/03/2016 21:40    Procedures Procedures (including critical care time)  Medications Ordered in ED Medications - No data to display   Initial Impression / Assessment and Plan / ED Course  I have reviewed the triage vital signs and the nursing notes.  Pertinent labs & imaging results that were available during my care of the patient were reviewed by me and considered in my medical decision making (see chart for details).  Clinical Course     Patient with right lower quadrant pain. She has moderately tender to palpation on exam. She does have her appendix. Will check CT abdomen pelvis. She is afebrile. She had some vomiting 2 days ago, but has not had any yesterday or today. Laboratory workup is reassuring.  CT scan is unremarkable for new or acute findings. Recommend primary care follow-up.  Final Clinical Impressions(s) / ED Diagnoses   Final diagnoses:  Generalized abdominal pain    New Prescriptions New Prescriptions   No medications on file   I personally performed the services described in this documentation, which was scribed in my presence. The recorded information has been reviewed and is accurate.       Roxy Horsemanobert Wallice Granville, PA-C 09/03/16 2214    Doug SouSam Jacubowitz,  MD 09/04/16 [id number]

## 2016-09-03 NOTE — ED Triage Notes (Signed)
Pt c/o right lower abd  Burning  pain x 3 days

## 2016-09-03 NOTE — ED Notes (Signed)
Pt c/o RLQ, right pelvic pain since Friday.  She had an episode of emesis on Thursday, non since then.  She is tender in that area, pain is worse with movement.  Pt denies vaginal bleeding, denies vaginal discharge.

## 2016-12-21 ENCOUNTER — Other Ambulatory Visit: Payer: Self-pay | Admitting: Specialist

## 2016-12-21 DIAGNOSIS — M545 Low back pain, unspecified: Secondary | ICD-10-CM

## 2016-12-31 ENCOUNTER — Other Ambulatory Visit (HOSPITAL_COMMUNITY): Payer: Self-pay | Admitting: Orthopedic Surgery

## 2016-12-31 ENCOUNTER — Inpatient Hospital Stay
Admission: RE | Admit: 2016-12-31 | Discharge: 2016-12-31 | Disposition: A | Discharge status: Home / Self Care | Payer: Medicare Other | Source: Ambulatory Visit | Attending: Specialist | Admitting: Specialist

## 2016-12-31 DIAGNOSIS — Z471 Aftercare following joint replacement surgery: Secondary | ICD-10-CM

## 2016-12-31 DIAGNOSIS — M25552 Pain in left hip: Secondary | ICD-10-CM

## 2016-12-31 DIAGNOSIS — Z96642 Presence of left artificial hip joint: Principal | ICD-10-CM

## 2016-12-31 NOTE — Discharge Instructions (Signed)

## 2017-01-04 ENCOUNTER — Ambulatory Visit
Admission: RE | Admit: 2017-01-04 | Discharge: 2017-01-04 | Disposition: A | Discharge status: Home / Self Care | Payer: Medicare HMO | Source: Ambulatory Visit | Attending: Specialist | Admitting: Specialist

## 2017-01-04 DIAGNOSIS — M545 Low back pain, unspecified: Secondary | ICD-10-CM

## 2017-01-04 MED ORDER — METHYLPREDNISOLONE ACETATE 40 MG/ML INJ SUSP (RADIOLOG
120.0000 mg | Freq: Once | INTRAMUSCULAR | Status: AC
  Administered 2017-01-04: 120 mg via INTRA_ARTICULAR

## 2017-01-04 MED ORDER — IOPAMIDOL (ISOVUE-M 200) INJECTION 41%
1.0000 mL | Freq: Once | INTRAMUSCULAR | Status: AC
  Administered 2017-01-04: 1 mL via INTRA_ARTICULAR

## 2017-01-04 NOTE — Discharge Instructions (Signed)

## 2017-01-07 ENCOUNTER — Encounter (HOSPITAL_COMMUNITY): Payer: Medicare Other

## 2017-01-08 ENCOUNTER — Encounter (HOSPITAL_COMMUNITY)
Admission: RE | Admit: 2017-01-08 | Discharge: 2017-01-08 | Disposition: A | Discharge status: Home / Self Care | Payer: Medicare HMO | Source: Ambulatory Visit | Attending: Orthopedic Surgery | Admitting: Orthopedic Surgery

## 2017-01-08 DIAGNOSIS — Z471 Aftercare following joint replacement surgery: Secondary | ICD-10-CM | POA: Insufficient documentation

## 2017-01-08 DIAGNOSIS — Z96642 Presence of left artificial hip joint: Secondary | ICD-10-CM | POA: Insufficient documentation

## 2017-01-08 DIAGNOSIS — M25552 Pain in left hip: Secondary | ICD-10-CM | POA: Insufficient documentation

## 2017-01-08 MED ORDER — TECHNETIUM TC 99M MEDRONATE IV KIT
25.0000 | PACK | Freq: Once | INTRAVENOUS | Status: AC | PRN
  Administered 2017-01-08: 22 via INTRAVENOUS

## 2017-03-31 DIAGNOSIS — L0231 Cutaneous abscess of buttock: Secondary | ICD-10-CM | POA: Diagnosis present

## 2017-03-31 DIAGNOSIS — Z96642 Presence of left artificial hip joint: Secondary | ICD-10-CM | POA: Insufficient documentation

## 2017-03-31 DIAGNOSIS — I10 Essential (primary) hypertension: Secondary | ICD-10-CM | POA: Insufficient documentation

## 2017-03-31 DIAGNOSIS — Z79899 Other long term (current) drug therapy: Secondary | ICD-10-CM | POA: Diagnosis not present

## 2017-03-31 DIAGNOSIS — Z96651 Presence of right artificial knee joint: Secondary | ICD-10-CM | POA: Insufficient documentation

## 2017-03-31 DIAGNOSIS — L0232 Furuncle of buttock: Secondary | ICD-10-CM | POA: Insufficient documentation

## 2017-04-01 ENCOUNTER — Emergency Department (HOSPITAL_BASED_OUTPATIENT_CLINIC_OR_DEPARTMENT_OTHER)
Admission: EM | Admit: 2017-04-01 | Discharge: 2017-04-01 | Disposition: A | Discharge status: Home / Self Care | Payer: Medicare HMO | Attending: Emergency Medicine | Admitting: Emergency Medicine

## 2017-04-01 ENCOUNTER — Encounter (HOSPITAL_BASED_OUTPATIENT_CLINIC_OR_DEPARTMENT_OTHER): Payer: Self-pay | Admitting: Emergency Medicine

## 2017-04-01 DIAGNOSIS — L0232 Furuncle of buttock: Secondary | ICD-10-CM | POA: Diagnosis not present

## 2017-04-01 DIAGNOSIS — L0292 Furuncle, unspecified: Secondary | ICD-10-CM

## 2017-04-01 MED ORDER — OXYCODONE-ACETAMINOPHEN 5-325 MG PO TABS: 1.0000 | ORAL_TABLET | ORAL | 0 refills | Status: DC | PRN

## 2017-04-01 MED ORDER — DOXYCYCLINE HYCLATE 100 MG PO CAPS: 100.0000 mg | ORAL_CAPSULE | Freq: Two times a day (BID) | ORAL | 0 refills | Status: DC

## 2017-04-01 MED ORDER — OXYCODONE-ACETAMINOPHEN 5-325 MG PO TABS
1.0000 | ORAL_TABLET | Freq: Once | ORAL | Status: AC
  Administered 2017-04-01: 1 via ORAL
  Filled 2017-04-01: qty 1

## 2017-04-01 MED ORDER — DOXYCYCLINE HYCLATE 100 MG PO TABS
100.0000 mg | ORAL_TABLET | Freq: Once | ORAL | Status: AC
  Administered 2017-04-01: 100 mg via ORAL
  Filled 2017-04-01: qty 1

## 2017-04-01 NOTE — ED Triage Notes (Signed)
Patient states that she is having pain to her bilateral buttocks. Reports that she has 9 bolis on her butt that "came up" on tues.

## 2017-04-01 NOTE — ED Provider Notes (Addendum)
[facility] Provider Note: Lowella DellJ. Lane Mykah Bellomo, MD, FACEP  CSN: [id number] MRN: [id number] ARRIVAL: 03/31/17 at 2347 ROOM: [facility]01/[facility]01   CHIEF COMPLAINT  Abscess   HISTORY OF PRESENT ILLNESS  Megan Robles is a 46 y.o. female who complains of 9 tender swollen areas on her buttocks and posterior upper thighs that began 5 days ago. They have increased in size and tenderness over that time. She rates her pain as an 8 out of 10, worse with sitting. The lesions are noted to have overlying pustules.   [place] with [place] [facility] [place] reveals the patient has received only one prescription for tramadol in the past year.   Past Medical History:  Diagnosis Date  . Anemia   . Arthritis   . Depression   . Fibromyalgia   . History of blood transfusion 2004/2015  . Hypertension   . Pneumonia   . URI (upper respiratory infection) 03/19/14   productive cough with yellow nasal discharge with fever- did not seek treatment- 7/17/15states resolved except occ cough now    Past Surgical History:  Procedure Laterality Date  . ABDOMINAL HYSTERECTOMY    . CHOLECYSTECTOMY  2004  . COLONOSCOPY  02/15/2012   Procedure: COLONOSCOPY;  Surgeon: Theda BelfastPatrick D Hung, MD;  Location: [facility];  Service: Endoscopy;  Laterality: N/A;  . DILATION AND CURETTAGE OF UTERUS  2004  . ESOPHAGOGASTRODUODENOSCOPY  02/15/2012   Procedure: ESOPHAGOGASTRODUODENOSCOPY (EGD);  Surgeon: Theda BelfastPatrick D Hung, MD;  [facility];  Service: Endoscopy;  Laterality: N/A;  . TOTAL HIP ARTHROPLASTY Left 01/26/2014   Procedure: LEFT TOTAL HIP ARTHROPLASTY ANTERIOR APPROACH;  Surgeon: Shelda PalMatthew D Olin, MD;  Location: [facility];  Service: Orthopedics;  Laterality: Left;  . TOTAL KNEE ARTHROPLASTY Right 04/13/2014   Procedure: RIGHT TOTAL KNEE ARTHROPLASTY;  Surgeon: Shelda PalMatthew D Olin, MD;  Location: [facility];  Service: Orthopedics;  Laterality: Right;    Family History  Problem Relation Age of  Onset  . COPD Mother     Social History  Substance Use Topics  . Smoking status: Never Smoker  . Smokeless tobacco: Never Used  . Alcohol use No    Prior to Admission medications   Medication Sig Start Date End Date Taking? Authorizing Provider  diclofenac sodium (VOLTAREN) 1 % GEL Apply 4 g topically 2 (two) times daily as needed (pain).    [provider]  DULoxetine (CYMBALTA) 60 MG capsule Take 60 mg by mouth at bedtime.     [provider]  ferrous sulfate 325 (65 FE) MG tablet Take 352 mg by mouth 3 (three) times daily. 08/09/15   [provider]  fluticasone (FLONASE) 50 MCG/ACT nasal spray Place 2 sprays into both nostrils daily. Patient taking differently: Place 2 sprays into both nostrils daily as needed for allergies or rhinitis.  11/03/14   Muthersbaugh, Dahlia ClientHannah, PA-C  hydrochlorothiazide (HYDRODIURIL) 25 MG tablet Take 25 mg by mouth daily.    [provider]  lisinopril (PRINIVIL,ZESTRIL) 10 MG tablet Take 10 mg by mouth daily.    [provider]  methocarbamol (ROBAXIN) 500 MG tablet Take 1 tablet (500 mg total) by mouth 2 (two) times daily. 11/04/15   Patel-Mills, Lorelle FormosaHanna, PA-C  naproxen (NAPROSYN) 500 MG tablet Take 1 tablet (500 mg total) by mouth 2 (two) times daily. 11/04/15   Patel-Mills, Lorelle FormosaHanna, PA-C  naproxen sodium (ANAPROX) 220 MG tablet Take 220 mg by mouth every 12 (twelve) hours as needed (pain).    [provider]  oxyCODONE-acetaminophen (PERCOCET/ROXICET) 5-325 MG tablet Take 2 tablets by mouth every 4 (four) hours as needed for severe pain. 11/04/15   Patel-Mills, Lorelle Formosa, PA-C    Allergies Banana; Kiwi extract; Pineapple; and Meperidine   REVIEW OF SYSTEMS  Negative except as noted here or in the History of Present Illness.   PHYSICAL EXAMINATION  Initial Vital Signs Blood pressure (!) 140/99, pulse 77, temperature 98.3 F (36.8 C), temperature source Oral, resp. rate 20, height 5\' 4"  (1.626 m), weight  113.4 kg (250 lb), last menstrual period 01/10/2015, SpO2 97 %.  Examination General: Well-developed, obese female in no acute distress; appearance consistent with age of record HENT: normocephalic; atraumatic Eyes: Normal appearance Neck: supple Heart: regular rate and rhythm Lungs: clear to auscultation bilaterally Abdomen: soft; nondistended Extremities: No deformity; full range of motion; pulses normal Neurologic: Awake, alert and oriented; motor function intact in all extremities and symmetric; no facial droop Skin: Warm and dry; a total of 10 tender, erythematous lesions of the buttocks and posterior upper thighs with overlying pustules consistent with early abscesses Psychiatric: Normal mood and affect   RESULTS  Summary of this visit's results, reviewed by myself:   EKG Interpretation  Date/Time:    Ventricular Rate:    PR Interval:    QRS Duration:   QT Interval:    QTC Calculation:   R Axis:     Text Interpretation:        Laboratory Studies: No results found for this or any previous visit (from the past 24 hour(s)). Imaging Studies: No results found.  ED COURSE  Nursing notes and initial vitals signs, including pulse oximetry, reviewed.  Vitals:   03/31/17 2357  BP: (!) 140/99  Pulse: 77  Resp: 20  Temp: 98.3 F (36.8 C)  TempSrc: Oral  SpO2: 97%  Weight: 113.4 kg (250 lb)  Height: 5\' 4"  (1.626 m)    PROCEDURES   I&D The abscesses were prepped with chlorhexidine. An 18-gauge needle was then used to unroof the overlying pustules. There was drainage of pus from 2 of the lesions, no pus was expressible from the other lesions. The patient tolerated this well and there were no immediate complications. We will dress the lesions and treat with antibiotics as I don't think invasive I&D is in the patient's best interest at this time.  She was advised to return to the ED if lesions are worsening rather than improving over the next 3 days.  ED DIAGNOSES      ICD-10-CM   1. Boils L02.92        Davian Hanshaw, MD 04/01/17 0102    Paula Libra, MD 04/01/17 4401

## 2017-09-02 ENCOUNTER — Encounter (HOSPITAL_BASED_OUTPATIENT_CLINIC_OR_DEPARTMENT_OTHER): Payer: Self-pay | Admitting: Emergency Medicine

## 2017-09-02 ENCOUNTER — Emergency Department (HOSPITAL_BASED_OUTPATIENT_CLINIC_OR_DEPARTMENT_OTHER)
Admission: EM | Admit: 2017-09-02 | Discharge: 2017-09-02 | Disposition: A | Discharge status: Home / Self Care | Payer: Medicare HMO | Attending: Emergency Medicine | Admitting: Emergency Medicine

## 2017-09-02 ENCOUNTER — Other Ambulatory Visit: Payer: Self-pay

## 2017-09-02 ENCOUNTER — Emergency Department (HOSPITAL_BASED_OUTPATIENT_CLINIC_OR_DEPARTMENT_OTHER): Payer: Medicare HMO

## 2017-09-02 DIAGNOSIS — Y92481 Parking lot as the place of occurrence of the external cause: Secondary | ICD-10-CM | POA: Diagnosis not present

## 2017-09-02 DIAGNOSIS — Z96642 Presence of left artificial hip joint: Secondary | ICD-10-CM | POA: Diagnosis not present

## 2017-09-02 DIAGNOSIS — S99912A Unspecified injury of left ankle, initial encounter: Secondary | ICD-10-CM | POA: Diagnosis present

## 2017-09-02 DIAGNOSIS — Z96651 Presence of right artificial knee joint: Secondary | ICD-10-CM | POA: Diagnosis not present

## 2017-09-02 DIAGNOSIS — I10 Essential (primary) hypertension: Secondary | ICD-10-CM | POA: Diagnosis not present

## 2017-09-02 DIAGNOSIS — Y998 Other external cause status: Secondary | ICD-10-CM | POA: Insufficient documentation

## 2017-09-02 DIAGNOSIS — S93402A Sprain of unspecified ligament of left ankle, initial encounter: Secondary | ICD-10-CM | POA: Diagnosis not present

## 2017-09-02 DIAGNOSIS — Y9301 Activity, walking, marching and hiking: Secondary | ICD-10-CM | POA: Diagnosis not present

## 2017-09-02 DIAGNOSIS — Z79899 Other long term (current) drug therapy: Secondary | ICD-10-CM | POA: Insufficient documentation

## 2017-09-02 DIAGNOSIS — W010XXA Fall on same level from slipping, tripping and stumbling without subsequent striking against object, initial encounter: Secondary | ICD-10-CM | POA: Diagnosis not present

## 2017-09-02 MED ORDER — ACETAMINOPHEN 500 MG PO TABS
1000.0000 mg | ORAL_TABLET | Freq: Once | ORAL | Status: DC
  Filled 2017-09-02: qty 2

## 2017-09-02 MED ORDER — KETOROLAC TROMETHAMINE 60 MG/2ML IM SOLN
60.0000 mg | Freq: Once | INTRAMUSCULAR | Status: AC
  Administered 2017-09-02: 60 mg via INTRAMUSCULAR
  Filled 2017-09-02: qty 2

## 2017-09-02 NOTE — ED Notes (Signed)
Patient transported to X-ray 

## 2017-09-02 NOTE — ED Notes (Signed)
ED Provider at bedside. 

## 2017-09-02 NOTE — Discharge Instructions (Signed)
You can also take 2 extra strength tylenol 4 times a day to help with pain if Embeda is not working.

## 2017-09-02 NOTE — ED Provider Notes (Signed)
[facility] EMERGENCY DEPARTMENT Provider Note   CSN: [id number] Arrival date & time: 09/02/17  1510     History   Chief Complaint Chief Complaint  Patient presents with  . Ankle Injury    HPI Megan Robles is a 46 y.o. female.  She is a 46 year old female with a history of hypertension, depression, anemia, fibromyalgia, multiple prior surgeries who is currently on Embeda presenting today after a mechanical fall at the grocery store.  Patient was walking from her car and she slipped on ice twisting her left ankle.  She fell to the ground but denies hitting her head, loss of consciousness or any other injury.  Left ankle pain is 10 out of 10 and severe in nature.  Took her normal pain medication around 11:00 today the way she normally does but is not helping her pain at this time.  She has no pain at the knee or upper hip area it is all at the ankle.  She has been able to hop but not able to bear any weight on that leg.  She complains of some mild numbness to the toes but L has feeling.  She is able to wiggle all of her toes.   The history is provided by the patient.    Past Medical History:  Diagnosis Date  . Anemia   . Arthritis   . Depression   . Fibromyalgia   . History of blood transfusion 2004/2015  . Hypertension   . Pneumonia   . URI (upper respiratory infection) 03/19/14   productive cough with yellow nasal discharge with fever- did not seek treatment- 7/17/15states resolved except occ cough now    Patient Active Problem List   Diagnosis Date Noted  . UTI (urinary tract infection) 04/16/2014  . 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 Expected blood loss anemia 01/28/2014  . Morbid obesity (HCC) 01/28/2014  . S/P total hip arthroplasty 01/26/2014    Past Surgical History:  Procedure Laterality Date  . ABDOMINAL HYSTERECTOMY    . CHOLECYSTECTOMY  2004  . COLONOSCOPY  02/15/2012   Procedure: COLONOSCOPY;  Surgeon: Theda BelfastPatrick D Hung, MD;  Location: [facility];   Service: Endoscopy;  Laterality: N/A;  . DILATION AND CURETTAGE OF UTERUS  2004  . ESOPHAGOGASTRODUODENOSCOPY  02/15/2012   Procedure: ESOPHAGOGASTRODUODENOSCOPY (EGD);  Surgeon: Theda BelfastPatrick D Hung, MD;  [facility];  Service: Endoscopy;  Laterality: N/A;  . TOTAL HIP ARTHROPLASTY Left 01/26/2014   Procedure: LEFT TOTAL HIP ARTHROPLASTY ANTERIOR APPROACH;  Surgeon: Shelda PalMatthew D Olin, MD;  Location: [facility];  Service: Orthopedics;  Laterality: Left;  . TOTAL KNEE ARTHROPLASTY Right 04/13/2014   Procedure: RIGHT TOTAL KNEE ARTHROPLASTY;  Surgeon: Shelda PalMatthew D Olin, MD;  Location: [facility];  Service: Orthopedics;  Laterality: Right;    OB History    No data available       Home Medications    Prior to Admission medications   Medication Sig Start Date End Date Taking? Authorizing Provider  Morphine-Naltrexone (EMBEDA) 20-0.8 MG CPCR Take by mouth.   Yes [provider]  diclofenac sodium (VOLTAREN) 1 % GEL Apply 4 g topically 2 (two) times daily as needed (pain).    [provider]  doxycycline (VIBRAMYCIN) 100 MG capsule Take 1 capsule (100 mg total) by mouth 2 (two) times daily. One po bid x 7 days 04/01/17   Molpus, John, MD  DULoxetine (CYMBALTA) 60 MG capsule Take 60 mg by mouth at bedtime.     [provider]  ferrous sulfate 325 (65 FE) MG tablet Take 352 mg by mouth 3 (three) times daily. 08/09/15   [provider]  fluticasone (FLONASE) 50 MCG/ACT nasal spray Place 2 sprays into both nostrils daily. Patient taking differently: Place 2 sprays into both nostrils daily as needed for allergies or rhinitis.  11/03/14   Muthersbaugh, Dahlia Client, PA-C  hydrochlorothiazide (HYDRODIURIL) 25 MG tablet Take 25 mg by mouth daily.    [provider]  lisinopril (PRINIVIL,ZESTRIL) 10 MG tablet Take 10 mg by mouth daily.    [provider]  methocarbamol (ROBAXIN) 500 MG tablet Take 1 tablet (500 mg total) by mouth 2 (two) times daily. 11/04/15    Patel-Mills, Lorelle Formosa, PA-C  naproxen (NAPROSYN) 500 MG tablet Take 1 tablet (500 mg total) by mouth 2 (two) times daily. 11/04/15   Patel-Mills, Lorelle Formosa, PA-C  naproxen sodium (ANAPROX) 220 MG tablet Take 220 mg by mouth every 12 (twelve) hours as needed (pain).    [provider]  oxyCODONE-acetaminophen (PERCOCET/ROXICET) 5-325 MG tablet Take 1 tablet by mouth every 4 (four) hours as needed (for pain). 04/01/17   Molpus, John, MD    Family History Family History  Problem Relation Age of Onset  . COPD Mother     Social History Social History   Tobacco Use  . Smoking status: Never Smoker  . Smokeless tobacco: Never Used  Substance Use Topics  . Alcohol use: No  . Drug use: No     Allergies   Banana; Kiwi extract; Pineapple; and Meperidine   Review of Systems Review of Systems  All other systems reviewed and are negative.    Physical Exam Updated Vital Signs BP (!) 146/67 (BP Location: Right Arm)   Pulse (!) 44   Temp 98.2 F (36.8 C) (Oral)   Resp 16   Ht 5\' 4"  (1.626 m)   Wt 113.4 kg (250 lb)   LMP 01/10/2015   SpO2 100%   BMI 42.91 kg/m   Physical Exam  Constitutional: She is oriented to person, place, and time. She appears well-developed and well-nourished. No distress.  HENT:  Head: Normocephalic and atraumatic.  Eyes: Pupils are equal, round, and reactive to light.  Cardiovascular: Normal rate.  Pulmonary/Chest: Breath sounds normal.  Musculoskeletal: She exhibits edema and tenderness.       Left ankle: She exhibits decreased range of motion and swelling. She exhibits no ecchymosis, no deformity and normal pulse. Tenderness. Lateral malleolus and medial malleolus tenderness found. No head of 5th metatarsal and no proximal fibula tenderness found.  Neurological: She is alert and oriented to person, place, and time. She has normal strength. No sensory deficit.  Skin: Skin is warm.  Psychiatric: She has a normal mood and affect. Her behavior is normal.    Nursing note and vitals reviewed.    ED Treatments / Results  Labs (all labs ordered are listed, but only abnormal results are displayed) Labs Reviewed - No data to display  EKG  EKG Interpretation None       Radiology Dg Ankle Complete Left  Result Date: 09/02/2017 CLINICAL DATA:  Slipped and fell injuring left ankle. EXAM: LEFT ANKLE COMPLETE - 3+ VIEW COMPARISON:  12/07/2014 FINDINGS: There is no evidence of fracture, dislocation, or joint effusion. Plantar heel spur noted. There is no evidence of arthropathy or other focal bone abnormality. Diffuse soft tissue swelling. IMPRESSION: 1. Diffuse soft tissue swelling. 2. No focal bone abnormality. Electronically Signed   By: Veronda Prude.D.  On: 09/02/2017 16:04    Procedures Procedures (including critical care time)  Medications Ordered in ED Medications  ketorolac (TORADOL) injection 60 mg (not administered)     Initial Impression / Assessment and Plan / ED Course  I have reviewed the triage vital signs and the nursing notes.  Pertinent labs & imaging results that were available during my care of the patient were reviewed by me and considered in my medical decision making (see chart for details).     Patient with a mechanical fall today with severe pain of the left ankle with swelling and tenderness over the lateral and medial malleolus.  Neurovascularly intact at this time.  X-rays are pending.  4:38 PM X-ray neg for break.  Treated conservatively with ice, elevation and tylenol.  Final Clinical Impressions(s) / ED Diagnoses   Final diagnoses:  Sprain of left ankle, unspecified ligament, initial encounter    ED Discharge Orders    None       Gwyneth SproutPlunkett, Doaa Kendzierski, MD 09/02/17 [phone]

## 2017-09-02 NOTE — ED Notes (Signed)
ED Provider at bedside discussing test results and dispo plan of care. 

## 2017-09-02 NOTE — ED Triage Notes (Addendum)
Reports slip and fall when walking into house today.  Reports pain to left ankle.  Reports she took embeda earlier today and is not getting relief from pain.

## 2017-12-02 ENCOUNTER — Other Ambulatory Visit: Payer: Self-pay | Admitting: Specialist

## 2017-12-02 DIAGNOSIS — M4310 Spondylolisthesis, site unspecified: Secondary | ICD-10-CM

## 2017-12-05 ENCOUNTER — Ambulatory Visit
Admission: RE | Admit: 2017-12-05 | Discharge: 2017-12-05 | Disposition: A | Discharge status: Home / Self Care | Payer: Medicare Other | Source: Ambulatory Visit | Attending: Specialist | Admitting: Specialist

## 2017-12-05 DIAGNOSIS — M4310 Spondylolisthesis, site unspecified: Secondary | ICD-10-CM

## 2017-12-05 MED ORDER — IOPAMIDOL (ISOVUE-M 200) INJECTION 41%
1.0000 mL | Freq: Once | INTRAMUSCULAR | Status: AC
  Administered 2017-12-05: 1 mL via EPIDURAL

## 2017-12-05 MED ORDER — METHYLPREDNISOLONE ACETATE 40 MG/ML INJ SUSP (RADIOLOG
120.0000 mg | Freq: Once | INTRAMUSCULAR | Status: AC
  Administered 2017-12-05: 120 mg via EPIDURAL

## 2017-12-05 NOTE — Discharge Instructions (Signed)

## 2017-12-08 ENCOUNTER — Encounter (HOSPITAL_BASED_OUTPATIENT_CLINIC_OR_DEPARTMENT_OTHER): Payer: Self-pay | Admitting: *Deleted

## 2017-12-08 ENCOUNTER — Emergency Department (HOSPITAL_BASED_OUTPATIENT_CLINIC_OR_DEPARTMENT_OTHER)
Admission: EM | Admit: 2017-12-08 | Discharge: 2017-12-08 | Disposition: A | Discharge status: Home / Self Care | Payer: Medicare Other | Attending: Emergency Medicine | Admitting: Emergency Medicine

## 2017-12-08 ENCOUNTER — Other Ambulatory Visit: Payer: Self-pay

## 2017-12-08 DIAGNOSIS — Z79899 Other long term (current) drug therapy: Secondary | ICD-10-CM | POA: Diagnosis not present

## 2017-12-08 DIAGNOSIS — R1013 Epigastric pain: Secondary | ICD-10-CM | POA: Diagnosis not present

## 2017-12-08 DIAGNOSIS — R51 Headache: Secondary | ICD-10-CM | POA: Diagnosis present

## 2017-12-08 DIAGNOSIS — G43009 Migraine without aura, not intractable, without status migrainosus: Secondary | ICD-10-CM

## 2017-12-08 DIAGNOSIS — I10 Essential (primary) hypertension: Secondary | ICD-10-CM | POA: Insufficient documentation

## 2017-12-08 LAB — CBC WITH DIFFERENTIAL/PLATELET
Basophils Absolute: 0 10*3/uL (ref 0.0–0.1)
Basophils Relative: 1 %
Eosinophils Absolute: 0.1 10*3/uL (ref 0.0–0.7)
Eosinophils Relative: 1 %
HEMATOCRIT: 39.3 % (ref 36.0–46.0)
Hemoglobin: 13 g/dL (ref 12.0–15.0)
LYMPHS ABS: 1.9 10*3/uL (ref 0.7–4.0)
LYMPHS PCT: 23 %
MCH: 29.6 pg (ref 26.0–34.0)
MCHC: 33.1 g/dL (ref 30.0–36.0)
MCV: 89.5 fL (ref 78.0–100.0)
MONOS PCT: 8 %
Monocytes Absolute: 0.6 10*3/uL (ref 0.1–1.0)
NEUTROS ABS: 5.5 10*3/uL (ref 1.7–7.7)
NEUTROS PCT: 67 %
Platelets: 294 10*3/uL (ref 150–400)
RBC: 4.39 MIL/uL (ref 3.87–5.11)
RDW: 13 % (ref 11.5–15.5)
WBC: 8.1 10*3/uL (ref 4.0–10.5)

## 2017-12-08 LAB — COMPREHENSIVE METABOLIC PANEL
ALT: 19 U/L (ref 14–54)
AST: 15 U/L (ref 15–41)
Albumin: 3.7 g/dL (ref 3.5–5.0)
Alkaline Phosphatase: 59 U/L (ref 38–126)
Anion gap: 10 (ref 5–15)
BUN: 18 mg/dL (ref 6–20)
CHLORIDE: 102 mmol/L (ref 101–111)
CO2: 25 mmol/L (ref 22–32)
CREATININE: 0.65 mg/dL (ref 0.44–1.00)
Calcium: 9.2 mg/dL (ref 8.9–10.3)
Glucose, Bld: 102 mg/dL — ABNORMAL HIGH (ref 65–99)
POTASSIUM: 3.3 mmol/L — AB (ref 3.5–5.1)
Sodium: 137 mmol/L (ref 135–145)
TOTAL PROTEIN: 7.1 g/dL (ref 6.5–8.1)
Total Bilirubin: 0.5 mg/dL (ref 0.3–1.2)

## 2017-12-08 LAB — COOXEMETRY PANEL
Carboxyhemoglobin: 1 % (ref 0.5–1.5)
Methemoglobin: 0.9 % (ref 0.0–1.5)
O2 Saturation: 56.1 %
Total hemoglobin: 13.6 g/dL (ref 12.0–16.0)

## 2017-12-08 LAB — TROPONIN I: Troponin I: <0.03 ng/mL (ref <0.03)

## 2017-12-08 LAB — CK: Total CK: 42 U/L (ref 38–234)

## 2017-12-08 LAB — LIPASE, BLOOD: LIPASE: 22 U/L (ref 11–51)

## 2017-12-08 MED ORDER — DEXAMETHASONE SODIUM PHOSPHATE 10 MG/ML IJ SOLN
10.0000 mg | Freq: Once | INTRAMUSCULAR | Status: AC
  Administered 2017-12-08: 10 mg via INTRAVENOUS
  Filled 2017-12-08: qty 1

## 2017-12-08 MED ORDER — PROCHLORPERAZINE EDISYLATE 5 MG/ML IJ SOLN
10.0000 mg | Freq: Once | INTRAMUSCULAR | Status: AC
  Administered 2017-12-08: 10 mg via INTRAVENOUS
  Filled 2017-12-08: qty 2

## 2017-12-08 MED ORDER — SODIUM CHLORIDE 0.9 % IV BOLUS (SEPSIS)
1000.0000 mL | Freq: Once | INTRAVENOUS | Status: AC
  Administered 2017-12-08: 1000 mL via INTRAVENOUS

## 2017-12-08 MED ORDER — GI COCKTAIL ~~LOC~~
30.0000 mL | Freq: Once | ORAL | Status: AC
  Administered 2017-12-08: 30 mL via ORAL
  Filled 2017-12-08: qty 30

## 2017-12-08 MED ORDER — DIPHENHYDRAMINE HCL 50 MG/ML IJ SOLN: 25.0000 mg | Freq: Once | INTRAMUSCULAR | Status: DC

## 2017-12-08 NOTE — ED Notes (Signed)
During triage pt c/o feeling very hot "like I am going to pass out". Voiced need to have BM. Pt ambulatory with unsteady gait, RN assisted pt to BR

## 2017-12-08 NOTE — ED Notes (Signed)
Pt placed on 15L NRB.  Lab notified of need to call courier to transport coox panel to main lab.

## 2017-12-08 NOTE — ED Provider Notes (Signed)
[facility] EMERGENCY DEPARTMENT Provider Note  CSN: [id number] Arrival date & time: 12/08/17 1459  Chief Complaint(s) Headache  HPI Megan Robles is a 47 y.o. female   The history is provided by the patient.  Migraine  This is a recurrent problem. The current episode started 2 days ago. The problem occurs constantly. The problem has been gradually worsening. Associated symptoms include abdominal pain (epigastric burning). Pertinent negatives include no chest pain, no headaches and no shortness of breath. Exacerbated by: light. The symptoms are relieved by ASA. She has tried ASA for the symptoms. The treatment provided moderate relief.   Pt reports that she smells gas at her home. No one else has smelled it or had headaches.  Also complaining of 2 days of epigastric abd "burning" after eating home made spring rolls. Endorses some nausea. No emesis. Has constipation from being on morphine, but still having BM and passing gas. Reports h/o gastric sleeve.  Past Medical History Past Medical History:  Diagnosis Date  . Anemia   . Arthritis   . Depression   . Fibromyalgia   . History of blood transfusion 2004/2015  . Hypertension   . Pneumonia   . URI (upper respiratory infection) 03/19/14   productive cough with yellow nasal discharge with fever- did not seek treatment- 7/17/15states resolved except occ cough now   Patient Active Problem List   Diagnosis Date Noted  . UTI (urinary tract infection) 04/16/2014  . 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 Expected blood loss anemia 01/28/2014  . Morbid obesity (HCC) 01/28/2014  . S/P total hip arthroplasty 01/26/2014   Home Medication(s) Prior to Admission medications   Medication Sig Start Date End Date Taking? Authorizing Provider  diclofenac sodium (VOLTAREN) 1 % GEL Apply 4 g topically 2 (two) times daily as needed (pain).   Yes [provider]  DULoxetine (CYMBALTA) 60 MG capsule Take 60 mg by mouth at bedtime.    Yes  [provider]  hydrochlorothiazide (HYDRODIURIL) 25 MG tablet Take 25 mg by mouth daily.   Yes [provider]  Morphine-Naltrexone (EMBEDA) 20-0.8 MG CPCR Take by mouth.   Yes [provider]  doxycycline (VIBRAMYCIN) 100 MG capsule Take 1 capsule (100 mg total) by mouth 2 (two) times daily. One po bid x 7 days 04/01/17   Molpus, John, MD  ferrous sulfate 325 (65 FE) MG tablet Take 352 mg by mouth 3 (three) times daily. 08/09/15   [provider]  fluticasone (FLONASE) 50 MCG/ACT nasal spray Place 2 sprays into both nostrils daily. Patient taking differently: Place 2 sprays into both nostrils daily as needed for allergies or rhinitis.  11/03/14   Muthersbaugh, Dahlia Client, PA-C  lisinopril (PRINIVIL,ZESTRIL) 10 MG tablet Take 10 mg by mouth daily.    [provider]  methocarbamol (ROBAXIN) 500 MG tablet Take 1 tablet (500 mg total) by mouth 2 (two) times daily. 11/04/15   Patel-Mills, Lorelle Formosa, PA-C  naproxen (NAPROSYN) 500 MG tablet Take 1 tablet (500 mg total) by mouth 2 (two) times daily. 11/04/15   Patel-Mills, Lorelle Formosa, PA-C  naproxen sodium (ANAPROX) 220 MG tablet Take 220 mg by mouth every 12 (twelve) hours as needed (pain).    [provider]  oxyCODONE-acetaminophen (PERCOCET/ROXICET) 5-325 MG tablet Take 1 tablet by mouth every 4 (four) hours as needed (for pain). 04/01/17   Molpus, Jonny Ruiz, MD  Past Surgical History Past Surgical History:  Procedure Laterality Date  . ABDOMINAL HYSTERECTOMY    . CHOLECYSTECTOMY  2004  . COLONOSCOPY  02/15/2012   Procedure: COLONOSCOPY;  Surgeon: Theda Belfast, MD;  Location: [facility];  Service: Endoscopy;  Laterality: N/A;  . DILATION AND CURETTAGE OF UTERUS  2004  . ESOPHAGOGASTRODUODENOSCOPY  02/15/2012   Procedure: ESOPHAGOGASTRODUODENOSCOPY (EGD);  Surgeon: Theda Belfast, MD;   Location[facility];  Service: Endoscopy;  Laterality: N/A;  . LAPAROSCOPIC GASTRIC SLEEVE RESECTION    . TOTAL HIP ARTHROPLASTY Left 01/26/2014   Procedure: LEFT TOTAL HIP ARTHROPLASTY ANTERIOR APPROACH;  Surgeon: [name], MD;  Location: [facility];  Service: Orthopedics;  Laterality: Left;  . TOTAL KNEE ARTHROPLASTY Right 04/13/2014   Procedure: RIGHT TOTAL KNEE ARTHROPLASTY;  Surgeon: [name], MD;  Location: [facility];  Service: Orthopedics;  Laterality: Right;   Family History Family History  Problem Relation Age of Onset  . COPD Mother     Social History Social History   Tobacco Use  . Smoking status: Never Smoker  . Smokeless tobacco: Never Used  Substance Use Topics  . Alcohol use: No  . Drug use: No   Allergies Banana; Kiwi extract; Pineapple; and Meperidine  Review of Systems Review of Systems  Respiratory: Negative for shortness of breath.   Cardiovascular: Negative for chest pain.  Gastrointestinal: Positive for abdominal pain (epigastric burning).  Neurological: Negative for headaches.   All other systems are reviewed and are negative for acute change except as noted in the HPI  Physical Exam Vital Signs  I have reviewed the triage vital signs BP 130/85 (BP Location: Left Arm)   Pulse 70   Temp 97.8 F (36.6 C) (Oral)   Resp 20   Ht 5\' 4"  (1.626 m)   Wt 108.9 kg (240 lb)   LMP 01/10/2015   SpO2 100%   BMI 41.20 kg/m   Physical Exam  Constitutional: She is oriented to person, place, and time. She appears well-developed and well-nourished. No distress.  HENT:  Head: Normocephalic and atraumatic.  Nose: Nose normal.  Eyes: Conjunctivae and EOM are normal. Pupils are equal, round, and reactive to light. Right eye exhibits no discharge. Left eye exhibits no discharge. No scleral icterus.  Neck: Normal range of motion. Neck supple.  Cardiovascular: Normal rate and regular rhythm. Exam reveals no gallop and no friction rub.  No murmur  heard. Pulmonary/Chest: Effort normal and breath sounds normal. No stridor. No respiratory distress. She has no rales.  Abdominal: Soft. She exhibits no distension. There is tenderness (mild discomfort) in the epigastric area. There is no rigidity, no rebound and no guarding.  Musculoskeletal: She exhibits no edema or tenderness.  Neurological: She is alert and oriented to person, place, and time.  Mental Status:  Alert and oriented to person, place, and time.  Attention and concentration normal.  Speech clear.  Recent memory is intact  Cranial Nerves:  II Visual Fields: Intact to confrontation. Visual fields intact. III, IV, VI: Pupils equal and reactive to light and near. Full eye movement without nystagmus  V Facial Sensation: Normal. No weakness of masticatory muscles  VII: No facial weakness or asymmetry  VIII Auditory Acuity: Grossly normal  IX/X: The uvula is midline; the palate elevates symmetrically  XI: Normal sternocleidomastoid and trapezius strength  XII: The tongue is midline. No atrophy or fasciculations.   Motor System: Muscle Strength: 5/5 and symmetric in the upper and lower extremities. No pronation  or drift.  Muscle Tone: Tone and muscle bulk are normal in the upper and lower extremities.   Reflexes: DTRs: No Clonus Coordination:  No tremor.  Sensation: Intact to light touch, and pinprick.  Gait: Routine gait normal.   Skin: Skin is warm and dry. No rash noted. She is not diaphoretic. No erythema.  Psychiatric: She has a normal mood and affect.  Vitals reviewed.   ED Results and Treatments Labs (all labs ordered are listed, but only abnormal results are displayed) Labs Reviewed  COMPREHENSIVE METABOLIC PANEL - Abnormal; Notable for the following components:      Result Value   Potassium 3.3 (*)    Glucose, Bld 102 (*)    All other components within normal limits  LIPASE, BLOOD  COOXEMETRY PANEL  CK  TROPONIN I  CBC WITH DIFFERENTIAL/PLATELET  CBC  WITH DIFFERENTIAL/PLATELET  BLOOD GAS, VENOUS                                                                                                                         EKG  EKG Interpretation  Date/Time:  Sunday December 08 2017 16:53:45 EDT Ventricular Rate:  54 PR Interval:    QRS Duration: 107 QT Interval:  436 QTC Calculation: 414 R Axis:     Text Interpretation:  Sinus rhythm Ventricular premature complex Otherwise no significant change Confirmed by Drema Pryardama, Klaryssa Fauth [phone]([id number]) on 12/08/2017 5:26:50 PM      Radiology No results found. Pertinent labs & imaging results that were available during my care of the patient were reviewed by me and considered in my medical decision making (see chart for details).  Medications Ordered in ED Medications  sodium chloride 0.9 % bolus 1,000 mL (1,000 mLs Intravenous New Bag/Given 12/08/17 1616)  dexamethasone (DECADRON) injection 10 mg (10 mg Intravenous Given 12/08/17 1616)  prochlorperazine (COMPAZINE) injection 10 mg (10 mg Intravenous Given 12/08/17 1616)  gi cocktail (Maalox,Lidocaine,Donnatal) (30 mLs Oral Given 12/08/17 1616)                                                                                                                                    Procedures Procedures  (including critical care time)  Medical Decision Making / ED Course I have reviewed the nursing notes for this encounter and the patient's prior records (if available in EHR or on provided paperwork).     1. Headache/CO exposure  Typical  migraine headache for the pt. Non focal neuro exam. No recent head trauma. No fever. Doubt meningitis. Doubt intracranial bleed. Doubt IIH. No indication for imaging.   CO monitor at 4. Placed on NRB O2. Will get Coox due to possible CO exposure.    Will treat with migraine cocktail and reevaluate.  Coox obtained >85min after NRB treatment due to timing of  transportation of sample.  Significant improvement following migraine  cocktail and O2.   Medical illustrator. Evaluated the property and did not find evidence of leak.   2. abd pain Mild discomfort with palpation. Not peritonitic. Will get screening labs. Given CO exposure will rule out cardiac etiology. EKG nonischemic. Trop negative.   Given GI cocktail for possible GI etiology which completely relieved her pain. Labs reassuring w/o without evidence of biliary obstruction, pancreatitis, significant electrolyte derangements or renal insufficiency.  Doubt serious intra-abdominal inflammatory/infectious process or small bowel obstruction.  Do not feel that advanced imaging is necessary at this time.  The patient appears reasonably screened and/or stabilized for discharge and I doubt any other medical condition or other [facility] requiring further screening, evaluation, or treatment in the ED at this time prior to discharge.  The patient is safe for discharge with strict return precautions.   Final Clinical Impression(s) / ED Diagnoses Final diagnoses:  Migraine without aura and without status migrainosus, not intractable  Epigastric pain   Disposition: Discharge  Condition: Good  I have discussed the results, Dx and Tx plan with the patient who expressed understanding and agree(s) with the plan. Discharge instructions discussed at great length. The patient was given strict return precautions who verbalized understanding of the instructions. No further questions at time of discharge.    ED Discharge Orders    None       Follow Up: Vista Deck, NP [phone] [address] [place] [address] [phone]  Schedule an appointment as soon as possible for a visit  As needed      This chart was dictated using voice recognition software.  Despite best efforts to proofread,  errors can occur which can change the documentation meaning.   Nira Conn, MD 12/08/17 1945

## 2017-12-08 NOTE — ED Triage Notes (Signed)
Pt reports headache since Friday, worse today. Endorses nausea and photosensitivity. Pt took 2 aspirin earlier today without relief

## 2017-12-09 LAB — I-STAT VENOUS BLOOD GAS, ED
BICARBONATE: 26.1 mmol/L (ref 20.0–28.0)
O2 SAT: 59 %
PCO2 VEN: 48.4 mmHg (ref 44.0–60.0)
PO2 VEN: 33 mmHg (ref 32.0–45.0)
TCO2: 28 mmol/L (ref 22–32)
pH, Ven: 7.339 (ref 7.250–7.430)

## 2017-12-26 ENCOUNTER — Other Ambulatory Visit: Payer: Self-pay | Admitting: Specialist

## 2017-12-26 DIAGNOSIS — M5136 Other intervertebral disc degeneration, lumbar region: Secondary | ICD-10-CM

## 2018-01-07 ENCOUNTER — Ambulatory Visit
Admission: RE | Admit: 2018-01-07 | Discharge: 2018-01-07 | Disposition: A | Discharge status: Home / Self Care | Payer: Medicare Other | Source: Ambulatory Visit | Attending: Specialist | Admitting: Specialist

## 2018-01-07 DIAGNOSIS — M5136 Other intervertebral disc degeneration, lumbar region: Secondary | ICD-10-CM

## 2018-01-07 MED ORDER — METHYLPREDNISOLONE ACETATE 40 MG/ML INJ SUSP (RADIOLOG
120.0000 mg | Freq: Once | INTRAMUSCULAR | Status: AC
  Administered 2018-01-07: 120 mg via INTRA_ARTICULAR

## 2018-01-07 MED ORDER — IOPAMIDOL (ISOVUE-M 200) INJECTION 41%
1.0000 mL | Freq: Once | INTRAMUSCULAR | Status: AC
  Administered 2018-01-07: 1 mL via INTRA_ARTICULAR

## 2018-01-07 NOTE — Discharge Instructions (Signed)

## 2019-12-18 ENCOUNTER — Other Ambulatory Visit: Payer: Self-pay | Admitting: Specialist

## 2019-12-21 ENCOUNTER — Other Ambulatory Visit: Payer: Self-pay | Admitting: Specialist

## 2019-12-21 DIAGNOSIS — M545 Low back pain, unspecified: Secondary | ICD-10-CM

## 2019-12-21 DIAGNOSIS — G8929 Other chronic pain: Secondary | ICD-10-CM

## 2019-12-23 ENCOUNTER — Ambulatory Visit
Admission: RE | Admit: 2019-12-23 | Discharge: 2019-12-23 | Disposition: A | Discharge status: Home / Self Care | Payer: Medicare Other | Source: Ambulatory Visit | Attending: Specialist | Admitting: Specialist

## 2019-12-23 DIAGNOSIS — M545 Low back pain, unspecified: Secondary | ICD-10-CM

## 2019-12-23 DIAGNOSIS — G8929 Other chronic pain: Secondary | ICD-10-CM

## 2019-12-23 MED ORDER — IOPAMIDOL (ISOVUE-M 200) INJECTION 41%: 1.0000 mL | Freq: Once | INTRAMUSCULAR | Status: DC

## 2019-12-23 MED ORDER — METHYLPREDNISOLONE ACETATE 40 MG/ML INJ SUSP (RADIOLOG: 120.0000 mg | Freq: Once | INTRAMUSCULAR | Status: DC

## 2019-12-23 NOTE — Discharge Instructions (Signed)

## 2020-02-10 ENCOUNTER — Other Ambulatory Visit: Payer: Self-pay | Admitting: Physician Assistant

## 2020-02-10 DIAGNOSIS — Z1231 Encounter for screening mammogram for malignant neoplasm of breast: Secondary | ICD-10-CM

## 2020-06-26 ENCOUNTER — Encounter (HOSPITAL_BASED_OUTPATIENT_CLINIC_OR_DEPARTMENT_OTHER): Payer: Self-pay | Admitting: Emergency Medicine

## 2020-06-26 ENCOUNTER — Other Ambulatory Visit: Payer: Self-pay

## 2020-06-26 ENCOUNTER — Emergency Department (HOSPITAL_BASED_OUTPATIENT_CLINIC_OR_DEPARTMENT_OTHER)
Admission: EM | Admit: 2020-06-26 | Discharge: 2020-06-26 | Disposition: A | Discharge status: Home / Self Care | Payer: Medicare Other | Attending: Emergency Medicine | Admitting: Emergency Medicine

## 2020-06-26 DIAGNOSIS — Z5321 Procedure and treatment not carried out due to patient leaving prior to being seen by health care provider: Secondary | ICD-10-CM | POA: Diagnosis not present

## 2020-06-26 DIAGNOSIS — M25551 Pain in right hip: Secondary | ICD-10-CM | POA: Diagnosis present

## 2020-06-26 NOTE — ED Triage Notes (Signed)
Pt c/o pain to right hip onset today. Pt denies recent injury.

## 2020-07-22 ENCOUNTER — Other Ambulatory Visit (HOSPITAL_COMMUNITY): Payer: Self-pay | Admitting: General Surgery

## 2020-07-22 ENCOUNTER — Other Ambulatory Visit: Payer: Self-pay | Admitting: General Surgery

## 2020-08-05 ENCOUNTER — Ambulatory Visit (HOSPITAL_COMMUNITY)
Admission: RE | Admit: 2020-08-05 | Discharge: 2020-08-05 | Disposition: A | Discharge status: Home / Self Care | Payer: Medicare Other | Source: Ambulatory Visit | Attending: General Surgery | Admitting: General Surgery

## 2020-08-05 ENCOUNTER — Other Ambulatory Visit: Payer: Self-pay

## 2020-08-05 ENCOUNTER — Encounter (HOSPITAL_BASED_OUTPATIENT_CLINIC_OR_DEPARTMENT_OTHER): Payer: Self-pay | Admitting: Emergency Medicine

## 2020-08-05 ENCOUNTER — Emergency Department (HOSPITAL_BASED_OUTPATIENT_CLINIC_OR_DEPARTMENT_OTHER): Payer: Medicare Other

## 2020-08-05 ENCOUNTER — Emergency Department (HOSPITAL_BASED_OUTPATIENT_CLINIC_OR_DEPARTMENT_OTHER)
Admission: EM | Admit: 2020-08-05 | Discharge: 2020-08-06 | Disposition: A | Discharge status: Home / Self Care | Payer: Medicare Other | Attending: Emergency Medicine | Admitting: Emergency Medicine

## 2020-08-05 DIAGNOSIS — Z96642 Presence of left artificial hip joint: Secondary | ICD-10-CM | POA: Diagnosis not present

## 2020-08-05 DIAGNOSIS — R443 Hallucinations, unspecified: Secondary | ICD-10-CM | POA: Diagnosis present

## 2020-08-05 DIAGNOSIS — Z20822 Contact with and (suspected) exposure to covid-19: Secondary | ICD-10-CM | POA: Insufficient documentation

## 2020-08-05 DIAGNOSIS — Z79899 Other long term (current) drug therapy: Secondary | ICD-10-CM | POA: Diagnosis not present

## 2020-08-05 DIAGNOSIS — Z96651 Presence of right artificial knee joint: Secondary | ICD-10-CM | POA: Diagnosis not present

## 2020-08-05 DIAGNOSIS — F22 Delusional disorders: Secondary | ICD-10-CM | POA: Diagnosis not present

## 2020-08-05 DIAGNOSIS — I10 Essential (primary) hypertension: Secondary | ICD-10-CM | POA: Insufficient documentation

## 2020-08-05 LAB — URINALYSIS, ROUTINE W REFLEX MICROSCOPIC
Bilirubin Urine: NEGATIVE
Glucose, UA: NEGATIVE mg/dL
Hgb urine dipstick: NEGATIVE
Ketones, ur: NEGATIVE mg/dL
Leukocytes,Ua: NEGATIVE
Nitrite: NEGATIVE
Protein, ur: NEGATIVE mg/dL
Specific Gravity, Urine: 1.025 (ref 1.005–1.030)
pH: 6 (ref 5.0–8.0)

## 2020-08-05 LAB — CBC
HCT: 39.5 % (ref 36.0–46.0)
Hemoglobin: 12.7 g/dL (ref 12.0–15.0)
MCH: 29.3 pg (ref 26.0–34.0)
MCHC: 32.2 g/dL (ref 30.0–36.0)
MCV: 91.2 fL (ref 80.0–100.0)
Platelets: 310 10*3/uL (ref 150–400)
RBC: 4.33 MIL/uL (ref 3.87–5.11)
RDW: 12.8 % (ref 11.5–15.5)
WBC: 5.7 10*3/uL (ref 4.0–10.5)
nRBC: 0 % (ref 0.0–0.2)

## 2020-08-05 LAB — RAPID URINE DRUG SCREEN, HOSP PERFORMED
Amphetamines: NOT DETECTED
Barbiturates: NOT DETECTED
Benzodiazepines: NOT DETECTED
Cocaine: NOT DETECTED
Opiates: NOT DETECTED
Tetrahydrocannabinol: NOT DETECTED

## 2020-08-05 LAB — COMPREHENSIVE METABOLIC PANEL
ALT: 16 U/L (ref 0–44)
AST: 15 U/L (ref 15–41)
Albumin: 3.6 g/dL (ref 3.5–5.0)
Alkaline Phosphatase: 46 U/L (ref 38–126)
Anion gap: 8 (ref 5–15)
BUN: 11 mg/dL (ref 6–20)
CO2: 29 mmol/L (ref 22–32)
Calcium: 9 mg/dL (ref 8.9–10.3)
Chloride: 101 mmol/L (ref 98–111)
Creatinine, Ser: 0.75 mg/dL (ref 0.44–1.00)
GFR, Estimated: >60 mL/min (ref >60)
Glucose, Bld: 102 mg/dL — ABNORMAL HIGH (ref 70–99)
Potassium: 3.4 mmol/L — ABNORMAL LOW (ref 3.5–5.1)
Sodium: 138 mmol/L (ref 135–145)
Total Bilirubin: 0.5 mg/dL (ref 0.3–1.2)
Total Protein: 7.1 g/dL (ref 6.5–8.1)

## 2020-08-05 LAB — RESPIRATORY PANEL BY RT PCR (FLU A&B, COVID)
Influenza A by PCR: NEGATIVE
Influenza B by PCR: NEGATIVE
SARS Coronavirus 2 by RT PCR: NEGATIVE

## 2020-08-05 LAB — SALICYLATE LEVEL: Salicylate Lvl: <7 mg/dL — ABNORMAL LOW (ref 7.0–30.0)

## 2020-08-05 LAB — PREGNANCY, URINE: Preg Test, Ur: NEGATIVE

## 2020-08-05 LAB — ACETAMINOPHEN LEVEL: Acetaminophen (Tylenol), Serum: <10 ug/mL — ABNORMAL LOW (ref 10–30)

## 2020-08-05 LAB — ETHANOL: Alcohol, Ethyl (B): <10 mg/dL (ref <10)

## 2020-08-05 NOTE — ED Provider Notes (Signed)
[facility] EMERGENCY DEPARTMENT Provider Note   CSN: [id number] Arrival date & time: 08/05/20  1951     History Chief Complaint  Patient presents with  . Medical Clearance    Megan Robles is a 49 y.o. female.  She is brought in by her her son for evaluation of paranoia and hearing voices.  This has been going on for months maybe longer but seems to be getting more noticeable.  The patient herself agrees that she knows the cars are following her and that she thinks she has a tracking device in her car that helps them follow her.  She also communicates to a husband that her kids do not know about through the spiritual world.  She denies feeling suicidal or homicidal.  She is not having any visual hallucinations.  She denies any alcohol or drugs.  She takes oxycodone as needed for chronic pain.  The history is provided by the patient and a relative.  Mental Health Problem Presenting symptoms: delusional, hallucinations and paranoid behavior   Patient accompanied by:  Family member Degree of incapacity (severity):  Unable to specify Onset quality:  Gradual Timing:  Intermittent Progression:  Worsening Chronicity:  New Context: not alcohol use and not drug abuse   Treatment compliance:  Untreated Relieved by:  None tried Worsened by:  Nothing Ineffective treatments:  None tried Associated symptoms: no abdominal pain, no chest pain, no headaches and no poor judgment        Past Medical History:  Diagnosis Date  . Anemia   . Arthritis   . Depression   . Fibromyalgia   . History of blood transfusion 2004/2015  . Hypertension   . Pneumonia   . URI (upper respiratory infection) 03/19/14   productive cough with yellow nasal discharge with fever- did not seek treatment- 7/17/15states resolved except occ cough now    Patient Active Problem List   Diagnosis Date Noted  . UTI (urinary tract infection) 04/16/2014  . 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 Expected blood loss  anemia 01/28/2014  . Morbid obesity (HCC) 01/28/2014  . S/P total hip arthroplasty 01/26/2014    Past Surgical History:  Procedure Laterality Date  . ABDOMINAL HYSTERECTOMY    . CHOLECYSTECTOMY  2004  . COLONOSCOPY  02/15/2012   Procedure: COLONOSCOPY;  Surgeon: Theda Belfast, MD;  Location: [facility];  Service: Endoscopy;  Laterality: N/A;  . DILATION AND CURETTAGE OF UTERUS  2004  . ESOPHAGOGASTRODUODENOSCOPY  02/15/2012   Procedure: ESOPHAGOGASTRODUODENOSCOPY (EGD);  Surgeon: Theda Belfast, MD;  Location: [facility];  Service: Endoscopy;  Laterality: N/A;  . LAPAROSCOPIC GASTRIC SLEEVE RESECTION    . TOTAL HIP ARTHROPLASTY Left 01/26/2014   Procedure: LEFT TOTAL HIP ARTHROPLASTY ANTERIOR APPROACH;  Surgeon: [name], MD;  Location: [facility];  Service: Orthopedics;  Laterality: Left;  . TOTAL KNEE ARTHROPLASTY Right 04/13/2014   Procedure: RIGHT TOTAL KNEE ARTHROPLASTY;  Surgeon: [name], MD;  Location: [facility];  Service: Orthopedics;  Laterality: Right;     OB History   No obstetric history on file.     Family History  Problem Relation Age of Onset  . COPD Mother     Social History   Tobacco Use  . Smoking status: Never Smoker  . Smokeless tobacco: Never Used  Vaping Use  . Vaping Use: Never used  Substance Use Topics  . Alcohol use: No  . Drug use: No    Home Medications Prior to Admission medications  Medication Sig Start Date End Date Taking? Authorizing Provider  carvedilol (COREG) 3.125 MG tablet carvedilol 3.125 mg tablet  TAKE 1 TABLET BY MOUTH TWICE DAILY    [provider]  diclofenac sodium (VOLTAREN) 1 % GEL Apply 4 g topically 2 (two) times daily as needed (pain).    [provider]  doxycycline (VIBRAMYCIN) 100 MG capsule Take 1 capsule (100 mg total) by mouth 2 (two) times daily. One po bid x 7 days 04/01/17   Molpus, John, MD  DULoxetine (CYMBALTA) 60 MG capsule Take 60 mg by mouth at bedtime.     [provider]  ferrous sulfate 325 (65 FE) MG tablet Take 352 mg by mouth 3 (three) times daily. 08/09/15   [provider]  fluticasone (FLONASE) 50 MCG/ACT nasal spray Place 2 sprays into both nostrils daily. Patient taking differently: Place 2 sprays into both nostrils daily as needed for allergies or rhinitis.  11/03/14   Muthersbaugh, Dahlia Client, PA-C  hydrochlorothiazide (HYDRODIURIL) 25 MG tablet Take 25 mg by mouth daily.    [provider]  lisinopril (PRINIVIL,ZESTRIL) 10 MG tablet Take 10 mg by mouth daily.    [provider]  methocarbamol (ROBAXIN) 500 MG tablet Take 1 tablet (500 mg total) by mouth 2 (two) times daily. 11/04/15   Patel-Mills, Lorelle Formosa, PA-C  Morphine-Naltrexone (EMBEDA) 20-0.8 MG CPCR Take by mouth.    [provider]  naproxen (NAPROSYN) 500 MG tablet Take 1 tablet (500 mg total) by mouth 2 (two) times daily. 11/04/15   Patel-Mills, Lorelle Formosa, PA-C  naproxen sodium (ANAPROX) 220 MG tablet Take 220 mg by mouth every 12 (twelve) hours as needed (pain).    [provider]  oxyCODONE-acetaminophen (PERCOCET/ROXICET) 5-325 MG tablet Take 1 tablet by mouth every 4 (four) hours as needed (for pain). 04/01/17   Molpus, John, MD    Allergies    Banana, Kiwi extract, and Meperidine  Review of Systems   Review of Systems  Constitutional: Negative for fever.  HENT: Negative for sore throat.   Respiratory: Negative for shortness of breath.   Cardiovascular: Negative for chest pain.  Gastrointestinal: Negative for abdominal pain.  Genitourinary: Negative for dysuria.  Musculoskeletal: Negative for joint swelling.  Skin: Negative for rash.  Neurological: Negative for headaches.  Psychiatric/Behavioral: Positive for hallucinations and paranoia.    Physical Exam Updated Vital Signs BP 115/79 (BP Location: Right Arm)   Pulse 94   Temp 98.5 F (36.9 C) (Oral)   Resp 18   Ht 5\' 4"  (1.626 m)   Wt 116 kg   LMP 01/10/2015   SpO2 100%   BMI 43.90  kg/m   Physical Exam Vitals and nursing note reviewed.  Constitutional:      General: She is not in acute distress.    Appearance: Normal appearance. She is well-developed.  HENT:     Head: Normocephalic and atraumatic.  Eyes:     Conjunctiva/sclera: Conjunctivae normal.  Cardiovascular:     Rate and Rhythm: Normal rate and regular rhythm.     Heart sounds: No murmur heard.   Pulmonary:     Effort: Pulmonary effort is normal. No respiratory distress.     Breath sounds: Normal breath sounds.  Abdominal:     Palpations: Abdomen is soft.     Tenderness: There is no abdominal tenderness.  Musculoskeletal:        General: Normal range of motion.     Cervical back: Neck supple.  Skin:    General:  Skin is warm and dry.     Capillary Refill: Capillary refill takes less than 2 seconds.  Neurological:     General: No focal deficit present.     Mental Status: She is alert.  Psychiatric:        Mood and Affect: Mood normal.        Behavior: Behavior normal.     ED Results / Procedures / Treatments   Labs (all labs ordered are listed, but only abnormal results are displayed) Labs Reviewed  URINALYSIS, ROUTINE W REFLEX MICROSCOPIC - Abnormal; Notable for the following components:      Result Value   APPearance HAZY (*)    All other components within normal limits  COMPREHENSIVE METABOLIC PANEL - Abnormal; Notable for the following components:   Potassium 3.4 (*)    Glucose, Bld 102 (*)    All other components within normal limits  SALICYLATE LEVEL - Abnormal; Notable for the following components:   Salicylate Lvl <7.0 (*)    All other components within normal limits  ACETAMINOPHEN LEVEL - Abnormal; Notable for the following components:   Acetaminophen (Tylenol), Serum <10 (*)    All other components within normal limits  RAPID URINE DRUG SCREEN, HOSP PERFORMED  ETHANOL  CBC    EKG None  Radiology DG Chest 2 View  Result Date: 08/05/2020 CLINICAL DATA:   Preoperative chest x-ray for gastric sleeve surgery. Obesity. EXAM: CHEST - 2 VIEW COMPARISON:  12/08/2018 FINDINGS: The heart size and mediastinal contours are within normal limits. Both lungs are clear. The visualized skeletal structures are unremarkable. IMPRESSION: No active cardiopulmonary disease. Electronically Signed   By: Duanne GuessNicholas  Plundo D.O.   On: 08/05/2020 16:27   CT Head Wo Contrast  Result Date: 08/05/2020 CLINICAL DATA:  49 year old female with psychosis. EXAM: CT HEAD WITHOUT CONTRAST TECHNIQUE: Contiguous axial images were obtained from the base of the skull through the vertex without intravenous contrast. COMPARISON:  Head CT dated 10/15/2019. FINDINGS: Brain: The ventricles and sulci appropriate size for patient's age. The gray-white matter discrimination is preserved. There is no acute intracranial hemorrhage. No mass effect or midline shift no extra-axial fluid collection. Vascular: No hyperdense vessel or unexpected calcification. Skull: Normal. Negative for fracture or focal lesion. Sinuses/Orbits: No acute finding. Other: None IMPRESSION: Unremarkable noncontrast CT of the brain. Electronically Signed   By: Elgie CollardArash  Radparvar M.D.   On: 08/05/2020 22:57   DG UGI W SINGLE CM (SOL OR THIN BA)  Result Date: 08/05/2020 CLINICAL DATA:  49 year old female history of obesity under preoperative evaluation prior to potential bariatric surgery. Prior history of gastric sleeve surgery. EXAM: UPPER GI SERIES WITH KUB TECHNIQUE: After obtaining a scout radiograph a routine upper GI series was performed using thin and high density barium. FLUOROSCOPY TIME:  Fluoroscopy Time:  1 minutes and 54 seconds Radiation Exposure Index (if provided by the fluoroscopic device): 37.3 mGy COMPARISON:  None. FINDINGS: Preprocedural KUB demonstrates a nonobstructive bowel gas pattern, with no pneumoperitoneum. Surgical clips project over the right upper quadrant of the abdomen, likely from prior cholecystectomy.  Suture line in the left upper quadrant related to prior gastric sleeve. Single contrast images of the esophagus demonstrate no esophageal mass, stricture or esophageal ring. No hiatal hernia. Esophageal motility was normal. Single contrast images of the stomach demonstrate normal gastric anatomy. Within the limitations of the single contrast examination, no definite ulcer or gastric mass was noted. Duodenal bulb was normal in appearance. IMPRESSION: 1. Normal single contrast upper GI, as  above. 2. Postoperative changes of cholecystectomy and gastric sleeve. Electronically Signed   By: Trudie Reed M.D.   On: 08/05/2020 12:11    Procedures Procedures (including critical care time)  Medications Ordered in ED Medications - No data to display  ED Course  I have reviewed the triage vital signs and the nursing notes.  Pertinent labs & imaging results that were available during my care of the patient were reviewed by me and considered in my medical decision making (see chart for details).    MDM Rules/Calculators/A&P                         The patient has been placed in psychiatric observation due to the need to provide a safe environment for the patient while obtaining psychiatric consultation and evaluation, as well as ongoing medical and medication management to treat the patient's condition.  The patient has not been placed under full IVC at this time.\   Final Clinical Impression(s) / ED Diagnoses Final diagnoses:  Delusional disorder [facility])    Rx / DC Orders ED Discharge Orders    None       Terrilee Files, MD 08/06/20 [phone]

## 2020-08-05 NOTE — ED Triage Notes (Signed)
Patient presents with son requesting medical clearance; patient's son states she is having increased paranoia and believes people are following her. Patient denies SI or HI.

## 2020-08-06 NOTE — ED Notes (Signed)
Pt denies SI/HI/hallucinations/pain. IV removed, pt verbalized understanding of f/u info & left ED w/o incident.

## 2020-08-06 NOTE — ED Provider Notes (Signed)
This patient would like to be discharged home.  She is not under involuntary commitment.  She is polite and cooperative and not having any suicidal or homicidal ideation.     Trayvon Trumbull, Jonny Ruiz, MD 08/06/20 [phone]

## 2020-08-12 ENCOUNTER — Emergency Department (HOSPITAL_BASED_OUTPATIENT_CLINIC_OR_DEPARTMENT_OTHER)
Admission: EM | Admit: 2020-08-12 | Discharge: 2020-08-13 | Disposition: A | Discharge status: Other Acute Inpatient Hospital | Payer: Medicare Other | Attending: Emergency Medicine | Admitting: Emergency Medicine

## 2020-08-12 ENCOUNTER — Encounter (HOSPITAL_BASED_OUTPATIENT_CLINIC_OR_DEPARTMENT_OTHER): Payer: Self-pay | Admitting: *Deleted

## 2020-08-12 ENCOUNTER — Other Ambulatory Visit: Payer: Self-pay

## 2020-08-12 DIAGNOSIS — I10 Essential (primary) hypertension: Secondary | ICD-10-CM | POA: Insufficient documentation

## 2020-08-12 DIAGNOSIS — Z96642 Presence of left artificial hip joint: Secondary | ICD-10-CM | POA: Insufficient documentation

## 2020-08-12 DIAGNOSIS — F32A Depression, unspecified: Secondary | ICD-10-CM | POA: Insufficient documentation

## 2020-08-12 DIAGNOSIS — Z20822 Contact with and (suspected) exposure to covid-19: Secondary | ICD-10-CM | POA: Insufficient documentation

## 2020-08-12 DIAGNOSIS — Z96652 Presence of left artificial knee joint: Secondary | ICD-10-CM | POA: Insufficient documentation

## 2020-08-12 DIAGNOSIS — F29 Unspecified psychosis not due to a substance or known physiological condition: Secondary | ICD-10-CM | POA: Insufficient documentation

## 2020-08-12 DIAGNOSIS — Z79899 Other long term (current) drug therapy: Secondary | ICD-10-CM | POA: Insufficient documentation

## 2020-08-12 DIAGNOSIS — F22 Delusional disorders: Secondary | ICD-10-CM | POA: Insufficient documentation

## 2020-08-12 DIAGNOSIS — R440 Auditory hallucinations: Secondary | ICD-10-CM

## 2020-08-12 LAB — COMPREHENSIVE METABOLIC PANEL
ALT: 17 U/L (ref 0–44)
AST: 15 U/L (ref 15–41)
Albumin: 3.5 g/dL (ref 3.5–5.0)
Alkaline Phosphatase: 49 U/L (ref 38–126)
Anion gap: 8 (ref 5–15)
BUN: 14 mg/dL (ref 6–20)
CO2: 26 mmol/L (ref 22–32)
Calcium: 9.3 mg/dL (ref 8.9–10.3)
Chloride: 104 mmol/L (ref 98–111)
Creatinine, Ser: 0.83 mg/dL (ref 0.44–1.00)
GFR, Estimated: >60 mL/min (ref >60)
Glucose, Bld: 101 mg/dL — ABNORMAL HIGH (ref 70–99)
Potassium: 3.7 mmol/L (ref 3.5–5.1)
Sodium: 138 mmol/L (ref 135–145)
Total Bilirubin: 0.2 mg/dL — ABNORMAL LOW (ref 0.3–1.2)
Total Protein: 6.6 g/dL (ref 6.5–8.1)

## 2020-08-12 LAB — CBC WITH DIFFERENTIAL/PLATELET
Abs Immature Granulocytes: 0.01 10*3/uL (ref 0.00–0.07)
Basophils Absolute: 0.1 10*3/uL (ref 0.0–0.1)
Basophils Relative: 1 %
Eosinophils Absolute: 0.7 10*3/uL — ABNORMAL HIGH (ref 0.0–0.5)
Eosinophils Relative: 14 %
HCT: 39.2 % (ref 36.0–46.0)
Hemoglobin: 12.8 g/dL (ref 12.0–15.0)
Immature Granulocytes: 0 %
Lymphocytes Relative: 39 %
Lymphs Abs: 2 10*3/uL (ref 0.7–4.0)
MCH: 29.7 pg (ref 26.0–34.0)
MCHC: 32.7 g/dL (ref 30.0–36.0)
MCV: 91 fL (ref 80.0–100.0)
Monocytes Absolute: 0.5 10*3/uL (ref 0.1–1.0)
Monocytes Relative: 9 %
Neutro Abs: 1.9 10*3/uL (ref 1.7–7.7)
Neutrophils Relative %: 37 %
Platelets: 269 10*3/uL (ref 150–400)
RBC: 4.31 MIL/uL (ref 3.87–5.11)
RDW: 12.9 % (ref 11.5–15.5)
WBC: 5.2 10*3/uL (ref 4.0–10.5)
nRBC: 0 % (ref 0.0–0.2)

## 2020-08-12 LAB — RAPID URINE DRUG SCREEN, HOSP PERFORMED
Amphetamines: NOT DETECTED
Barbiturates: NOT DETECTED
Benzodiazepines: NOT DETECTED
Cocaine: NOT DETECTED
Opiates: NOT DETECTED
Tetrahydrocannabinol: NOT DETECTED

## 2020-08-12 LAB — RESP PANEL BY RT-PCR (FLU A&B, COVID) ARPGX2
Influenza A by PCR: NEGATIVE
Influenza B by PCR: NEGATIVE
SARS Coronavirus 2 by RT PCR: NEGATIVE

## 2020-08-12 LAB — SALICYLATE LEVEL: Salicylate Lvl: <7 mg/dL — ABNORMAL LOW (ref 7.0–30.0)

## 2020-08-12 LAB — ACETAMINOPHEN LEVEL: Acetaminophen (Tylenol), Serum: <10 ug/mL — ABNORMAL LOW (ref 10–30)

## 2020-08-12 LAB — PREGNANCY, URINE: Preg Test, Ur: NEGATIVE

## 2020-08-12 LAB — ETHANOL: Alcohol, Ethyl (B): <10 mg/dL (ref <10)

## 2020-08-12 NOTE — ED Notes (Signed)
TTS at bedside. 

## 2020-08-12 NOTE — ED Triage Notes (Signed)
Pt reports returning today to complete psych evaluation for paranoia
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
## 2020-08-12 NOTE — ED Notes (Signed)
ED Provider at bedside. 

## 2020-08-12 NOTE — ED Notes (Addendum)
Pt denies SI/HI, is calm & changed into scrubs w/o issue. No s/s aggression noted. Offered fluids/snacks. Pt is able to ambulate w/o issue, is low fall risk @ this time.
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
## 2020-08-13 ENCOUNTER — Encounter (HOSPITAL_BASED_OUTPATIENT_CLINIC_OR_DEPARTMENT_OTHER): Payer: Self-pay | Admitting: Registered Nurse

## 2020-08-13 DIAGNOSIS — F29 Unspecified psychosis not due to a substance or known physiological condition: Secondary | ICD-10-CM | POA: Diagnosis not present

## 2020-08-13 MED ORDER — LISINOPRIL 10 MG PO TABS
10.0000 mg | ORAL_TABLET | Freq: Every day | ORAL | Status: DC
  Administered 2020-08-13: 10 mg via ORAL
  Filled 2020-08-13: qty 1

## 2020-08-13 MED ORDER — CARVEDILOL 6.25 MG PO TABS: 3.1250 mg | ORAL_TABLET | Freq: Two times a day (BID) | ORAL | Status: DC

## 2020-08-13 MED ORDER — HYDROCHLOROTHIAZIDE 25 MG PO TABS
25.0000 mg | ORAL_TABLET | Freq: Every day | ORAL | Status: DC
  Administered 2020-08-13: 25 mg via ORAL
  Filled 2020-08-13: qty 1

## 2020-08-13 NOTE — ED Notes (Signed)
Micca Matura (Pts daughter) phone number- [phone]

## 2020-08-13 NOTE — Progress Notes (Signed)
Patient meets criteria for inpatient treatment per Berneice Heinrich NP. There is no bed availability at [facility] today, 11/20. CSW faxed referrals to the following facilities for review:  [facility]  TTS will continue to seek bed placement.   Trula Slade, MSW, LCSW Clinical Social Worker 08/13/2020 9:49 AM

## 2020-08-13 NOTE — ED Notes (Signed)
Notified CRN of order for 1:1, no additional staff available @ this time. Pt denies thoughts of SI/HI, is c/c & overall pleasant talking to self or singing.

## 2020-08-13 NOTE — Progress Notes (Signed)
Pt has been accepted to [facility] per [place] in admissions. She may arrive anytime today, 08/13/2020 to [facility] for admission. Accepting provider: Dr. Estill Cotta MD. Number for report: [phone]. CSW requested that RN fax IVC paperwork to: [phone] prior to pt transporting to [facility]. CSW left message for pt's RN, Sherin Quarry with [employer].   Rajan Burgard S. Alan Ripper, MSW, LCSW Clinical Social Worker 08/13/2020 10:04 AM

## 2020-08-13 NOTE — ED Notes (Signed)
RN spoke with [facility] assessment center regarding placement. [facility] assessment team to call RN back after providers meeting.

## 2020-08-13 NOTE — ED Notes (Signed)
Pt singing loudly in room, hyper-religious, polite upon approach. Able to make needs known w/o issue, gave food/fluids/blanket. Will continue to monitor & support.

## 2020-08-16 ENCOUNTER — Ambulatory Visit: Payer: Medicare Other | Admitting: Skilled Nursing Facility1

## 2020-09-13 NOTE — Patient Instructions (Addendum)
DUE TO COVID-19 ONLY ONE VISITOR IS ALLOWED TO COME WITH YOU AND STAY IN THE WAITING ROOM ONLY DURING PRE OP AND PROCEDURE DAY OF SURGERY. THE 1 VISITOR  MAY VISIT WITH YOU AFTER SURGERY IN YOUR PRIVATE ROOM DURING VISITING HOURS ONLY!  YOU NEED TO HAVE A COVID 19 TEST ON__12/27_____ @_1 :05______, THIS TEST MUST BE DONE BEFORE SURGERY,  COVID TESTING SITE [address] [place] , IT IS ON THE RIGHT GOING OUT [address] APPROXIMATELY  2 MINUTES PAST [employer] ON THE RIGHT. ONCE YOUR COVID TEST IS COMPLETED,  PLEASE BEGIN THE QUARANTINE INSTRUCTIONS AS OUTLINED IN YOUR HANDOUT.                Megan Robles    Your procedure is scheduled on: 09/20/20   Report to [facility] Main  Entrance   Report to admitting at 13:15     Call this number if you have problems the morning of surgery [phone]       BRUSH YOUR TEETH MORNING OF SURGERY AND RINSE YOUR MOUTH OUT, NO CHEWING GUM CANDY OR MINTS.  No food after midnight.    You may have clear liquid until 12:30 PM.    At 12:00 PM drink pre surgery drink.   Nothing by mouth after 12:30 PM.    Take these medicines the morning of surgery with A SIP OF WATER: Carvedilol                                 You may not have any metal on your body including hair pins and              piercings  Do not wear jewelry, make-up, lotions, powders or perfumes, deodorant             Do not wear nail polish on your fingernails.  Do not shave  48 hours prior to surgery.     Do not bring valuables to the hospital. [place] IS NOT             RESPONSIBLE   FOR VALUABLES.  Contacts, dentures or bridgework may not be worn into surgery.    Name and phone number of your driver:  Special Instructions: N/A              Please read over the following fact sheets you were given: _____________________________________________________________________             Megan Robles - Preparing for Surgery Before  surgery, you can play an important role.   Because skin is not sterile, your skin needs to be as free of germs as possible.   You can reduce the number of germs on your skin by washing with CHG (chlorahexidine gluconate) soap before surgery.   CHG is an antiseptic cleaner which kills germs and bonds with the skin to continue killing germs even after washing. Please DO NOT use if you have an allergy to CHG or antibacterial soaps.   If your skin becomes reddened/irritated stop using the CHG and inform your nurse when you arrive at Short Stay. Do not shave (including legs and underarms) for at least 48 hours prior to the first CHG shower.    Please follow these instructions carefully:  1.  Shower with CHG Soap the night before surgery and the  morning of Surgery.  2.  If you choose to wash your  hair, wash your hair first as usual with your  normal  shampoo.  3.  After you shampoo, rinse your hair and body thoroughly to remove the  shampoo.                                        4.  Use CHG as you would any other liquid soap.  You can apply chg directly  to the skin and wash                       Gently with a scrungie or clean washcloth.  5.  Apply the CHG Soap to your body ONLY FROM THE NECK DOWN.   Do not use on face/ open                           Wound or open sores. Avoid contact with eyes, ears mouth and genitals (private parts).                       Wash face,  Genitals (private parts) with your normal soap.             6.  Wash thoroughly, paying special attention to the area where your surgery  will be performed.  7.  Thoroughly rinse your body with warm water from the neck down.  8.  DO NOT shower/wash with your normal soap after using and rinsing off  the CHG Soap.             9.  Pat yourself dry with a clean towel.            10.  Wear clean pajamas.            11.  Place clean sheets on your bed the night of your first shower and do not  sleep with pets. Day of Surgery : Do not  apply any lotions/deodorants the morning of surgery.  Please wear clean clothes to the hospital/surgery center.  FAILURE TO FOLLOW THESE INSTRUCTIONS MAY RESULT IN THE CANCELLATION OF YOUR SURGERY PATIENT SIGNATURE_________________________________  NURSE SIGNATURE__________________________________  ________________________________________________________________________   Megan Robles  An incentive spirometer is a tool that can help keep your lungs clear and active. This tool measures how well you are filling your lungs with each breath. Taking long deep breaths may help reverse or decrease the chance of developing breathing (pulmonary) problems (especially infection) following:  A long period of time when you are unable to move or be active. BEFORE THE PROCEDURE   If the spirometer includes an indicator to show your best effort, your nurse or respiratory therapist will set it to a desired goal.  If possible, sit up straight or lean slightly forward. Try not to slouch.  Hold the incentive spirometer in an upright position. INSTRUCTIONS FOR USE  1. Sit on the edge of your bed if possible, or sit up as far as you can in bed or on a chair. 2. Hold the incentive spirometer in an upright position. 3. Breathe out normally. 4. Place the mouthpiece in your mouth and seal your lips tightly around it. 5. Breathe in slowly and as deeply as possible, raising the piston or the ball toward the top of the column. 6. Hold your breath for 3-5 seconds or for as long as possible. Allow the  piston or ball to fall to the bottom of the column. 7. Remove the mouthpiece from your mouth and breathe out normally. 8. Rest for a few seconds and repeat Steps 1 through 7 at least 10 times every 1-2 hours when you are awake. Take your time and take a few normal breaths between deep breaths. 9. The spirometer may include an indicator to show your best effort. Use the indicator as a goal to work toward during  each repetition. 10. After each set of 10 deep breaths, practice coughing to be sure your lungs are clear. If you have an incision (the cut made at the time of surgery), support your incision when coughing by placing a pillow or rolled up towels firmly against it. Once you are able to get out of bed, walk around indoors and cough well. You may stop using the incentive spirometer when instructed by your caregiver.  RISKS AND COMPLICATIONS  Take your time so you do not get dizzy or light-headed.  If you are in pain, you may need to take or ask for pain medication before doing incentive spirometry. It is harder to take a deep breath if you are having pain. AFTER USE  Rest and breathe slowly and easily.  It can be helpful to keep track of a log of your progress. Your caregiver can provide you with a simple table to help with this. If you are using the spirometer at home, follow these instructions: SEEK MEDICAL CARE IF:   You are having difficultly using the spirometer.  You have trouble using the spirometer as often as instructed.  Your pain medication is not giving enough relief while using the spirometer.  You develop fever of 100.5 F (38.1 C) or higher. SEEK IMMEDIATE MEDICAL CARE IF:   You cough up bloody sputum that had not been present before.  You develop fever of 102 F (38.9 C) or greater.  You develop worsening pain at or near the incision site. MAKE SURE YOU:   Understand these instructions.  Will watch your condition.  Will get help right away if you are not doing well or get worse. Document Released: 01/21/2007 Document Revised: 12/03/2011 Document Reviewed: 03/24/2007 [facility] Patient Information 2014 [place], [place].   ________________________________________________________________________

## 2020-09-14 ENCOUNTER — Encounter (HOSPITAL_COMMUNITY): Payer: Self-pay

## 2020-09-14 ENCOUNTER — Encounter (INDEPENDENT_AMBULATORY_CARE_PROVIDER_SITE_OTHER): Payer: Self-pay

## 2020-09-14 ENCOUNTER — Encounter (HOSPITAL_COMMUNITY)
Admission: RE | Admit: 2020-09-14 | Discharge: 2020-09-14 | Disposition: A | Discharge status: Home / Self Care | Payer: Medicare Other | Source: Ambulatory Visit | Attending: Orthopedic Surgery | Admitting: Orthopedic Surgery

## 2020-09-14 ENCOUNTER — Other Ambulatory Visit: Payer: Self-pay

## 2020-09-14 DIAGNOSIS — Z01812 Encounter for preprocedural laboratory examination: Secondary | ICD-10-CM | POA: Insufficient documentation

## 2020-09-14 LAB — CBC
HCT: 41 % (ref 36.0–46.0)
Hemoglobin: 13.2 g/dL (ref 12.0–15.0)
MCH: 30.1 pg (ref 26.0–34.0)
MCHC: 32.2 g/dL (ref 30.0–36.0)
MCV: 93.4 fL (ref 80.0–100.0)
Platelets: 280 10*3/uL (ref 150–400)
RBC: 4.39 MIL/uL (ref 3.87–5.11)
RDW: 13.2 % (ref 11.5–15.5)
WBC: 5.3 10*3/uL (ref 4.0–10.5)
nRBC: 0 % (ref 0.0–0.2)

## 2020-09-14 LAB — SURGICAL PCR SCREEN
MRSA, PCR: NEGATIVE
Staphylococcus aureus: NEGATIVE

## 2020-09-14 LAB — PROTIME-INR
INR: 1 (ref 0.8–1.2)
Prothrombin Time: 12.9 seconds (ref 11.4–15.2)

## 2020-09-14 LAB — BASIC METABOLIC PANEL
Anion gap: 9 (ref 5–15)
BUN: 10 mg/dL (ref 6–20)
CO2: 24 mmol/L (ref 22–32)
Calcium: 9.2 mg/dL (ref 8.9–10.3)
Chloride: 106 mmol/L (ref 98–111)
Creatinine, Ser: 0.6 mg/dL (ref 0.44–1.00)
GFR, Estimated: >60 mL/min (ref >60)
Glucose, Bld: 99 mg/dL (ref 70–99)
Potassium: 4.1 mmol/L (ref 3.5–5.1)
Sodium: 139 mmol/L (ref 135–145)

## 2020-09-14 LAB — APTT: aPTT: 31 seconds (ref 24–36)

## 2020-09-14 NOTE — Progress Notes (Signed)
COVID Vaccine Completed:No Date COVID Vaccine completed: COVID vaccine manufacturer: [employer]   PCP - C. Iheanacho Cardiologist - no  Chest x-ray - 08/05/20-Epic EKG - 08/05/20-Epic Stress Test - no ECHO - no Cardiac Cath - no Pacemaker/ICD device last checked:no  Sleep Study - yes- negative findings CPAP - no  Fasting Blood Sugar - NA Checks Blood Sugar _____ times a day  Blood Thinner Instructions:NA Aspirin Instructions: Last Dose:  Anesthesia review:   Patient denies shortness of breath, fever, cough and chest pain at PAT appointment Yes  Patient verbalized understanding of instructions that were given to them at the PAT appointment. Patient was also instructed that they will need to review over the PAT instructions again at home before surgery. Yes Pr reports no SOB with any activity but she rarely uses stairs.

## 2020-09-19 ENCOUNTER — Other Ambulatory Visit (HOSPITAL_COMMUNITY): Payer: Medicare Other

## 2020-09-20 ENCOUNTER — Ambulatory Visit (HOSPITAL_COMMUNITY): Admission: RE | Admit: 2020-09-20 | Payer: Medicare Other | Source: Home / Self Care | Admitting: Orthopedic Surgery

## 2020-09-20 ENCOUNTER — Encounter (HOSPITAL_COMMUNITY): Admission: RE | Payer: Self-pay | Source: Home / Self Care

## 2020-09-20 LAB — TYPE AND SCREEN
ABO/RH(D): O POS
Antibody Screen: NEGATIVE

## 2020-09-20 SURGERY — TOTAL KNEE REVISION
Anesthesia: Spinal | Site: Knee | Laterality: Right

## 2020-11-24 NOTE — Patient Instructions (Signed)
DUE TO COVID-19 ONLY ONE VISITOR IS ALLOWED TO COME WITH YOU AND STAY IN THE WAITING ROOM ONLY DURING PRE OP AND PROCEDURE.   IF YOU WILL BE ADMITTED INTO THE HOSPITAL YOU ARE ALLOWED ONLY TWO SUPPORT PEOPLE DURING VISITATION HOURS ONLY (10AM -8PM)   . The support person(s) may change daily. . The support person(s) must pass our screening, gel in and out, and wear a mask at all times, including in the patient's room. . Patients must also wear a mask when staff or their support person are in the room.  No visitors under the age of 55. Any visitor under the age of 51 must be accompanied by an adult.    COVID SWAB TESTING MUST BE COMPLETED ON:  Monday, 12-05-20 @    [address] [place], [place] [address]  (Must self quarantine after testing. Follow instructions on handout.)        Your procedure is scheduled on:   Thursday, 12-08-20   Report to [facility] Main  Entrance   Report to Short Stay at 5:30 AM   [facility])    Call this number if you have problems the morning of surgery [phone]   Do not eat food :After Midnight.   May have liquids until 4:15 AM  day of surgery  CLEAR LIQUID DIET  Foods Allowed                                                                     Foods Excluded  Water, Black Coffee and tea, regular and decaf             liquids that you cannot  Plain Jell-O in any flavor  (No red)                                    see through such as: Fruit ices (not with fruit pulp)                                      milk, soups, orange juice              Iced Popsicles (No red)                                      All solid food                                   Apple juices Sports drinks like Gatorade (No red) Lightly seasoned clear broth or consume(fat free) Sugar, honey syrup     Complete one Ensure drink the morning of surgery at 4:15 AM the day of surgery.       1. The day of surgery:  ? Drink ONE (1) Pre-Surgery Clear Ensure or G2 by am  the morning of surgery. Drink in one sitting. Do not sip.  ? This drink was given to you during your hospital  pre-op appointment  visit. ? Nothing else to drink after completing the  Pre-Surgery Clear Ensure or G2.          If you have questions, please contact your surgeon's office.     Oral Hygiene is also important to reduce your risk of infection.                                    Remember - BRUSH YOUR TEETH THE MORNING OF SURGERY WITH YOUR REGULAR TOOTHPASTE   Do NOT smoke after Midnight   Take these medicines the morning of surgery with A SIP OF WATER: Carvediolol                                You may not have any metal on your body including hair pins, jewelry, and body piercings             Do not wear make-up, lotions, powders, perfumes/cologne, or deodorant             Do not wear nail polish.  Do not shave  48 hours prior to surgery.          Do not bring valuables to the hospital. [place] IS NOT             RESPONSIBLE   FOR VALUABLES.   Contacts, dentures or bridgework may not be worn into surgery.   Bring small overnight bag day of surgery.     Special Instructions: Bring a copy of your healthcare power of attorney and living will documents         the day of surgery if you haven't scanned them in before.              Please read over the following fact sheets you were given: IF YOU HAVE QUESTIONS ABOUT YOUR PRE OP INSTRUCTIONS PLEASE CALL [phone]   [place] - Preparing for Surgery Before surgery, you can play an important role.  Because skin is not sterile, your skin needs to be as free of germs as possible.  You can reduce the number of germs on your skin by washing with CHG (chlorahexidine gluconate) soap before surgery.  CHG is an antiseptic cleaner which kills germs and bonds with the skin to continue killing germs even after washing. Please DO NOT use if you have an allergy to CHG or antibacterial soaps.  If your skin becomes reddened/irritated  stop using the CHG and inform your nurse when you arrive at Short Stay. Do not shave (including legs and underarms) for at least 48 hours prior to the first CHG shower.  You may shave your face/neck.  Please follow these instructions carefully:  1.  Shower with CHG Soap the night before surgery and the  morning of surgery.  2.  If you choose to wash your hair, wash your hair first as usual with your normal  shampoo.  3.  After you shampoo, rinse your hair and body thoroughly to remove the shampoo.                             4.  Use CHG as you would any other liquid soap.  You can apply chg directly to the skin and wash.  Gently with a scrungie or clean washcloth.  5.  Apply the  CHG Soap to your body ONLY FROM THE NECK DOWN.   Do   not use on face/ open                           Wound or open sores. Avoid contact with eyes, ears mouth and   genitals (private parts).                       Wash face,  Genitals (private parts) with your normal soap.             6.  Wash thoroughly, paying special attention to the area where your    surgery  will be performed.  7.  Thoroughly rinse your body with warm water from the neck down.  8.  DO NOT shower/wash with your normal soap after using and rinsing off the CHG Soap.                9.  Pat yourself dry with a clean towel.            10.  Wear clean pajamas.            11.  Place clean sheets on your bed the night of your first shower and do not  sleep with pets. Day of Surgery : Do not apply any lotions/deodorants the morning of surgery.  Please wear clean clothes to the hospital/surgery center.  FAILURE TO FOLLOW THESE INSTRUCTIONS MAY RESULT IN THE CANCELLATION OF YOUR SURGERY  PATIENT SIGNATURE_________________________________  NURSE SIGNATURE__________________________________  ________________________________________________________________________   Megan Robles  An incentive spirometer is a tool that can help keep your lungs  clear and active. This tool measures how well you are filling your lungs with each breath. Taking long deep breaths may help reverse or decrease the chance of developing breathing (pulmonary) problems (especially infection) following:  A long period of time when you are unable to move or be active. BEFORE THE PROCEDURE   If the spirometer includes an indicator to show your best effort, your nurse or respiratory therapist will set it to a desired goal.  If possible, sit up straight or lean slightly forward. Try not to slouch.  Hold the incentive spirometer in an upright position. INSTRUCTIONS FOR USE  1. Sit on the edge of your bed if possible, or sit up as far as you can in bed or on a chair. 2. Hold the incentive spirometer in an upright position. 3. Breathe out normally. 4. Place the mouthpiece in your mouth and seal your lips tightly around it. 5. Breathe in slowly and as deeply as possible, raising the piston or the ball toward the top of the column. 6. Hold your breath for 3-5 seconds or for as long as possible. Allow the piston or ball to fall to the bottom of the column. 7. Remove the mouthpiece from your mouth and breathe out normally. 8. Rest for a few seconds and repeat Steps 1 through 7 at least 10 times every 1-2 hours when you are awake. Take your time and take a few normal breaths between deep breaths. 9. The spirometer may include an indicator to show your best effort. Use the indicator as a goal to work toward during each repetition. 10. After each set of 10 deep breaths, practice coughing to be sure your lungs are clear. If you have an incision (the cut made at the time of surgery), support your incision when coughing  by placing a pillow or rolled up towels firmly against it. Once you are able to get out of bed, walk around indoors and cough well. You may stop using the incentive spirometer when instructed by your caregiver.  RISKS AND COMPLICATIONS  Take your time so you do  not get dizzy or light-headed.  If you are in pain, you may need to take or ask for pain medication before doing incentive spirometry. It is harder to take a deep breath if you are having pain. AFTER USE  Rest and breathe slowly and easily.  It can be helpful to keep track of a log of your progress. Your caregiver can provide you with a simple table to help with this. If you are using the spirometer at home, follow these instructions: SEEK MEDICAL CARE IF:   You are having difficultly using the spirometer.  You have trouble using the spirometer as often as instructed.  Your pain medication is not giving enough relief while using the spirometer.  You develop fever of 100.5 F (38.1 C) or higher. SEEK IMMEDIATE MEDICAL CARE IF:   You cough up bloody sputum that had not been present before.  You develop fever of 102 F (38.9 C) or greater.  You develop worsening pain at or near the incision site. MAKE SURE YOU:   Understand these instructions.  Will watch your condition.  Will get help right away if you are not doing well or get worse. Document Released: 01/21/2007 Document Revised: 12/03/2011 Document Reviewed: 03/24/2007 [employer] Patient Information 2014 [place], [place].   ________________________________________________________________________  WHAT IS A BLOOD TRANSFUSION? Blood Transfusion Information  A transfusion is the replacement of blood or some of its parts. Blood is made up of multiple cells which provide different functions.  Red blood cells carry oxygen and are used for blood loss replacement.  White blood cells fight against infection.  Platelets control bleeding.  Plasma helps clot blood.  Other blood products are available for specialized needs, such as hemophilia or other clotting disorders. BEFORE THE TRANSFUSION  Who gives blood for transfusions?   Healthy volunteers who are fully evaluated to make sure their blood is safe. This is blood bank  blood. Transfusion therapy is the safest it has ever been in the practice of medicine. Before blood is taken from a donor, a complete history is taken to make sure that person has no history of diseases nor engages in risky social behavior (examples are intravenous drug use or sexual activity with multiple partners). The donor's travel history is screened to minimize risk of transmitting infections, such as malaria. The donated blood is tested for signs of infectious diseases, such as HIV and hepatitis. The blood is then tested to be sure it is compatible with you in order to minimize the chance of a transfusion reaction. If you or a relative donates blood, this is often done in anticipation of surgery and is not appropriate for emergency situations. It takes many days to process the donated blood. RISKS AND COMPLICATIONS Although transfusion therapy is very safe and saves many lives, the main dangers of transfusion include:   Getting an infectious disease.  Developing a transfusion reaction. This is an allergic reaction to something in the blood you were given. Every precaution is taken to prevent this. The decision to have a blood transfusion has been considered carefully by your caregiver before blood is given. Blood is not given unless the benefits outweigh the risks. AFTER THE TRANSFUSION  Right after receiving a  blood transfusion, you will usually feel much better and more energetic. This is especially true if your red blood cells have gotten low (anemic). The transfusion raises the level of the red blood cells which carry oxygen, and this usually causes an energy increase.  The nurse administering the transfusion will monitor you carefully for complications. HOME CARE INSTRUCTIONS  No special instructions are needed after a transfusion. You may find your energy is better. Speak with your caregiver about any limitations on activity for underlying diseases you may have. SEEK MEDICAL CARE IF:    Your condition is not improving after your transfusion.  You develop redness or irritation at the intravenous (IV) site. SEEK IMMEDIATE MEDICAL CARE IF:  Any of the following symptoms occur over the next 12 hours:  Shaking chills.  You have a temperature by mouth above 102 F (38.9 C), not controlled by medicine.  Chest, back, or muscle pain.  People around you feel you are not acting correctly or are confused.  Shortness of breath or difficulty breathing.  Dizziness and fainting.  You get a rash or develop hives.  You have a decrease in urine output.  Your urine turns a dark color or changes to pink, red, or brown. Any of the following symptoms occur over the next 10 days:  You have a temperature by mouth above 102 F (38.9 C), not controlled by medicine.  Shortness of breath.  Weakness after normal activity.  The white part of the eye turns yellow (jaundice).  You have a decrease in the amount of urine or are urinating less often.  Your urine turns a dark color or changes to pink, red, or brown. Document Released: 09/07/2000 Document Revised: 12/03/2011 Document Reviewed: 04/26/2008 [facility] Patient Information 2014 [place], [place].  _______________________________________________________________________

## 2020-11-24 NOTE — Progress Notes (Signed)
COVID Vaccine Completed: Date COVID Vaccine completed: Has received booster: COVID vaccine manufacturer: Pfizer    [facility] [employer]  Date of COVID positive in last 90 days:  PCP - Chiquita Loth, PA Cardiologist -   Chest x-ray - 08-05-20 Epic EKG - 08-05-20 Epic Stress Test - 2017 Care Everywhere ECHO - 2017 Care Everywhere Cardiac Cath - 04-20-2016 Pacemaker/ICD device last checked:  Sleep Study -  CPAP -   Fasting Blood Sugar -  Checks Blood Sugar _____ times a day  Blood Thinner Instructions: Aspirin Instructions: Last Dose:  Activity level:  Unable to go up a flight of stairs without symptoms   Can go up a flight of stairs and activities of daily living without stopping and without symptoms   Able to exercise without symptoms     Anesthesia review:   Hx of chest pain evaluated in 2017  Patient denies shortness of breath, fever, cough and chest pain at PAT appointment   Patient verbalized understanding of instructions that were given to them at the PAT appointment. Patient was also instructed that they will need to review over the PAT instructions again at home before surgery.

## 2020-11-28 ENCOUNTER — Encounter (HOSPITAL_COMMUNITY)
Admission: RE | Admit: 2020-11-28 | Discharge: 2020-11-28 | Disposition: A | Discharge status: Home / Self Care | Payer: Medicare Other | Source: Ambulatory Visit | Attending: Orthopedic Surgery | Admitting: Orthopedic Surgery

## 2020-11-29 NOTE — Progress Notes (Signed)
DUE TO COVID-19 ONLY ONE VISITOR IS ALLOWED TO COME WITH YOU AND STAY IN THE WAITING ROOM ONLY DURING PRE OP AND PROCEDURE DAY OF SURGERY. THE 1 VISITOR  MAY VISIT WITH YOU AFTER SURGERY IN YOUR PRIVATE ROOM DURING VISITING HOURS ONLY!  YOU NEED TO HAVE A COVID 19 TEST ON_3/14/2022 ______ @_______ , THIS TEST MUST BE DONE BEFORE SURGERY,  COVID TESTING SITE [address] [place] , IT IS ON THE RIGHT GOING OUT [address] APPROXIMATELY  2 MINUTES PAST [employer] ON THE RIGHT. ONCE YOUR COVID TEST IS COMPLETED,  PLEASE BEGIN THE QUARANTINE INSTRUCTIONS AS OUTLINED IN YOUR HANDOUT.                Megan Robles  11/29/2020   Your procedure is scheduled on:  12/08/2020   Report to [facility] Main  Entrance   Report to admitting at    0515 AM     Call this number if you have problems the morning of surgery [phone]    REMEMBER: NO  SOLID FOOD CANDY OR GUM AFTER MIDNIGHT. CLEAR LIQUIDS UNTIL  0415am          . NOTHING BY MOUTH EXCEPT CLEAR LIQUIDS UNTIL    . PLEASE FINISH ENSURE DRINK PER SURGEON ORDER  WHICH NEEDS TO BE COMPLETED A T0415am    .      CLEAR LIQUID DIET   Foods Allowed                                                                    Coffee and tea, regular and decaf                            Fruit ices (not with fruit pulp)                                      Iced Popsicles                                    Carbonated beverages, regular and diet                                    Cranberry, grape and apple juices Sports drinks like Gatorade Lightly seasoned clear broth or consume(fat free) Sugar, honey syrup ___________________________________________________________________      BRUSH YOUR TEETH MORNING OF SURGERY AND RINSE YOUR MOUTH OUT, NO CHEWING GUM CANDY OR MINTS.     Take these medicines the morning of surgery with A SIP OF WATER: coreg   DO NOT TAKE ANY DIABETIC MEDICATIONS DAY OF YOUR SURGERY                                You may not have any metal on your body including hair pins and              piercings  Do not  wear jewelry, make-up, lotions, powders or perfumes, deodorant             Do not wear nail polish on your fingernails.  Do not shave  48 hours prior to surgery.              Men may shave face and neck.   Do not bring valuables to the hospital. [place].  Contacts, dentures or bridgework may not be worn into surgery.  Leave suitcase in the car. After surgery it may be brought to your room.     Patients discharged the day of surgery will not be allowed to drive home. IF YOU ARE HAVING SURGERY AND GOING HOME THE SAME DAY, YOU MUST HAVE AN ADULT TO DRIVE YOU HOME AND BE WITH YOU FOR 24 HOURS. YOU MAY GO HOME BY TAXI OR UBER OR ORTHERWISE, BUT AN ADULT MUST ACCOMPANY YOU HOME AND STAY WITH YOU FOR 24 HOURS.  Name and phone number of your driver:  Special Instructions: N/A              Please read over the following fact sheets you were given: _____________________________________________________________________  [facility] - Preparing for Surgery Before surgery, you can play an important role.  Because skin is not sterile, your skin needs to be as free of germs as possible.  You can reduce the number of germs on your skin by washing with CHG (chlorahexidine gluconate) soap before surgery.  CHG is an antiseptic cleaner which kills germs and bonds with the skin to continue killing germs even after washing. Please DO NOT use if you have an allergy to CHG or antibacterial soaps.  If your skin becomes reddened/irritated stop using the CHG and inform your nurse when you arrive at Short Stay. Do not shave (including legs and underarms) for at least 48 hours prior to the first CHG shower.  You may shave your face/neck. Please follow these instructions carefully:  1.  Shower with CHG Soap the night before surgery and the  morning of  Surgery.  2.  If you choose to wash your hair, wash your hair first as usual with your  normal  shampoo.  3.  After you shampoo, rinse your hair and body thoroughly to remove the  shampoo.                           4.  Use CHG as you would any other liquid soap.  You can apply chg directly  to the skin and wash                       Gently with a scrungie or clean washcloth.  5.  Apply the CHG Soap to your body ONLY FROM THE NECK DOWN.   Do not use on face/ open                           Wound or open sores. Avoid contact with eyes, ears mouth and genitals (private parts).                       Wash face,  Genitals (private parts) with your normal soap.             6.  Wash thoroughly, paying  special attention to the area where your surgery  will be performed.  7.  Thoroughly rinse your body with warm water from the neck down.  8.  DO NOT shower/wash with your normal soap after using and rinsing off  the CHG Soap.                9.  Pat yourself dry with a clean towel.            10.  Wear clean pajamas.            11.  Place clean sheets on your bed the night of your first shower and do not  sleep with pets. Day of Surgery : Do not apply any lotions/deodorants the morning of surgery.  Please wear clean clothes to the hospital/surgery center.  FAILURE TO FOLLOW THESE INSTRUCTIONS MAY RESULT IN THE CANCELLATION OF YOUR SURGERY PATIENT SIGNATURE_________________________________  NURSE SIGNATURE__________________________________  ________________________________________________________________________

## 2020-12-02 ENCOUNTER — Encounter (HOSPITAL_COMMUNITY)
Admission: RE | Admit: 2020-12-02 | Discharge: 2020-12-02 | Disposition: A | Discharge status: Home / Self Care | Payer: Medicare Other | Source: Ambulatory Visit | Attending: Orthopedic Surgery | Admitting: Orthopedic Surgery

## 2020-12-02 ENCOUNTER — Encounter (HOSPITAL_COMMUNITY): Payer: Self-pay

## 2020-12-02 ENCOUNTER — Other Ambulatory Visit: Payer: Self-pay

## 2020-12-02 DIAGNOSIS — Z01812 Encounter for preprocedural laboratory examination: Secondary | ICD-10-CM | POA: Insufficient documentation

## 2020-12-02 HISTORY — DX: Anxiety disorder, unspecified: F41.9

## 2020-12-02 HISTORY — DX: Cardiac murmur, unspecified: R01.1

## 2020-12-02 LAB — BASIC METABOLIC PANEL
Anion gap: 8 (ref 5–15)
BUN: 12 mg/dL (ref 6–20)
CO2: 23 mmol/L (ref 22–32)
Calcium: 9.6 mg/dL (ref 8.9–10.3)
Chloride: 108 mmol/L (ref 98–111)
Creatinine, Ser: 0.62 mg/dL (ref 0.44–1.00)
GFR, Estimated: >60 mL/min (ref >60)
Glucose, Bld: 99 mg/dL (ref 70–99)
Potassium: 3.7 mmol/L (ref 3.5–5.1)
Sodium: 139 mmol/L (ref 135–145)

## 2020-12-02 LAB — SURGICAL PCR SCREEN
MRSA, PCR: NEGATIVE
Staphylococcus aureus: NEGATIVE

## 2020-12-02 LAB — CBC
HCT: 41.5 % (ref 36.0–46.0)
Hemoglobin: 13.3 g/dL (ref 12.0–15.0)
MCH: 29.6 pg (ref 26.0–34.0)
MCHC: 32 g/dL (ref 30.0–36.0)
MCV: 92.4 fL (ref 80.0–100.0)
Platelets: 309 10*3/uL (ref 150–400)
RBC: 4.49 MIL/uL (ref 3.87–5.11)
RDW: 13.7 % (ref 11.5–15.5)
WBC: 6.3 10*3/uL (ref 4.0–10.5)
nRBC: 0 % (ref 0.0–0.2)

## 2020-12-02 NOTE — Progress Notes (Signed)
covid pos on 10/06/2020 have requested lab results from [facility].

## 2020-12-05 NOTE — Progress Notes (Signed)
Rerequested covid positive results since still have not received from Friday.,  Spoke with [facility] at [phone] and they are to fax over results.

## 2020-12-05 NOTE — Progress Notes (Signed)
Rerquested covid positive test results from [facility] this am.

## 2020-12-05 NOTE — Progress Notes (Signed)
Late Entry:  Pt came in for preop apt on 12/02/2020.  At time of preop appt when called back to dept for PST appt pt was talking on cell phone.  Pt continued to talk on cell phone as entered dept and got to nurse's room.  Informed pt that cell phone would need to be turned off during preop appt.  Informed pt that I would be reviewing medical history,  Reviewing preop instructions and doing vital signs.  Pt took mask down at beginning of preop appt and was drinking a drink .  Informed pt that she could not be drinking a drink during preop appt.  Masks need to be used during preop appt except for sips of drink. Pt completed drink and threw cup in trashcan.    Pt started laughing at one point during preop appt udring medical hx questions with no purpose.  Asked Travel Screening questions and pt stated she had tested positive for covid on 09/27/2020.  Asked pt if she had copy of covid results required by hospital protocol.  Pt stated " Who would have a copy of their results? " Explained to pt that some pts have a copy of their results or can email them to nurse.  Pt then again laughed . PT did inform nurse that covid test had been done at [facility] on [facility] in [place].  AT that point pt stated nurse was rude and asked for another person to do their preop.  Pt again stated nurse was rude and when nurse asked how were they rude pt stated " You are just rude and I do not like your attitude"  Nurse informed pt that she was asking questions that she asks any pt at a preop appt. Pt stated she wanted another person to do their preop appt.  Went to office of Chart Room and asked Derek Mound to completed preop appt which she completed.  Called and spoke with Chiropodist, Encarnacion Chu who came to dept and informed her of above.

## 2020-12-06 ENCOUNTER — Inpatient Hospital Stay (HOSPITAL_COMMUNITY): Payer: Medicare Other | Admitting: Certified Registered Nurse Anesthetist

## 2020-12-06 ENCOUNTER — Inpatient Hospital Stay (HOSPITAL_COMMUNITY): Payer: Medicare Other | Admitting: Physician Assistant

## 2020-12-06 NOTE — H&P (Signed)
TOTAL KNEE REVISION ADMISSION H&P  Patient is being admitted for right revision total knee arthroplasty.  Subjective:  Chief Complaint:right knee pain s/p TKA.  HPI: Megan Robles, 50 y.o. female, has a history of pain and functional disability in the right knee(s) due to failed previous arthroplasty and patient has failed non-surgical conservative treatments for greater than 12 weeks to include NSAID's and/or analgesics, supervised PT with diminished ADL's post treatment, use of assistive devices and activity modification. The indications for the revision of the total knee arthroplasty are loosening of one or more components. Onset of symptoms was gradual starting <1 year ago with rapidlly worsening course since that time.  Prior procedures on the right knee(s) include arthroplasty.  Patient currently rates pain in the right knee(s) at 7 out of 10 with activity. There is night pain, worsening of pain with activity and weight bearing, pain that interferes with activities of daily living, pain with passive range of motion, crepitus and joint swelling.  Patient has evidence of prosthetic loosening by imaging studies. This condition presents safety issues increasing the risk of falls.  There is no current active infection.  Risks, benefits and expectations were discussed with the patient.  Risks including but not limited to the risk of anesthesia, blood clots, nerve damage, blood vessel damage, failure of the prosthesis, infection and up to and including death.  Patient understand the risks, benefits and expectations and wishes to proceed with surgery.   D/C Plans:       Home   Post-op Meds:       No Rx given   Tranexamic Acid:      To be given - IV   Decadron:      Is to be given  FYI:      Xarelto  Oxycodone (prefers to increase Oxy and not go on Dilaudid)  DME:    Rx sent for - RW & 3-N-1  PT:   OPPT   Pharmacy: [facility] - [facility], [place]   Patient Active Problem List    Diagnosis Date Noted  . Psychosis (HCC)   . UTI (urinary tract infection) 04/16/2014  . 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 Expected blood loss anemia 01/28/2014  . Morbid obesity (HCC) 01/28/2014  . S/P total hip arthroplasty 01/26/2014   Past Medical History:  Diagnosis Date  . Anemia 2004  . Anxiety   . Arthritis   . Depression   . Fibromyalgia   . Heart murmur   . History of blood transfusion 2004/2015  . Hypertension   . Pneumonia 2004  . URI (upper respiratory infection) 03/19/14   productive cough with yellow nasal discharge with fever- did not seek treatment- 7/17/15states resolved except occ cough now    Past Surgical History:  Procedure Laterality Date  . ABDOMINAL HYSTERECTOMY  2016  . CHOLECYSTECTOMY  2004  . COLONOSCOPY  02/15/2012   Procedure: COLONOSCOPY;  Surgeon: Theda Belfast, MD;  Location: [facility] END[facility]COPY;  Service: Endoscopy;  Laterality: N/A;  . DILATION AND CURETTAGE OF UTERUS  2004  . ESOPHAGOGASTRODUODENOSCOPY  02/15/2012   Procedure: ESOPHAGOGASTRODUODENOSCOPY (EGD);  Surgeon: Theda Belfast, MD;  Location: [facility];  Service: Endoscopy;  Laterality: N/A;  . LAPAROSCOPIC GASTRIC SLEEVE RESECTION  2016  . TOTAL HIP ARTHROPLASTY Left 01/26/2014   Procedure: LEFT TOTAL HIP ARTHROPLASTY ANTERIOR APPROACH;  Surgeon: [name], MD;  Location: [facility] ORS;  Service: Orthopedics;  Laterality: Left;  . TOTAL KNEE ARTHROPLASTY Right 04/13/2014   Procedure:  RIGHT TOTAL KNEE ARTHROPLASTY;  Surgeon: [name], MD;  Location: [facility] OR[facility];  Service: Orthopedics;  Laterality: Right;    No current facility-administered medications for this encounter.   Current Outpatient Medications  Medication Sig Dispense Refill Last Dose  . Ascorbic Acid (VITAMIN C PO) Take 2 tablets by mouth daily.     Marland Kitchen BIOTIN PO Take 1 tablet by mouth daily.     . Biotin w/ Vitamins C & E (HAIR/SKIN/NAILS PO) Take 1 tablet by mouth daily.     Marland Kitchen CALCIUM PO Take 1 tablet by mouth daily.     . carvedilol  (COREG) 6.25 MG tablet Take 6.25 mg by mouth 2 (two) times daily.     . cholecalciferol (VITAMIN D3) 25 MCG (1000 UNIT) tablet Take 1,000 Units by mouth daily.     . hydrochlorothiazide (HYDRODIURIL) 25 MG tablet Take 25 mg by mouth daily.     Marland Kitchen lisinopril (PRINIVIL,ZESTRIL) 10 MG tablet Take 10 mg by mouth daily.     . Magnesium 200 MG TABS Take 400 mg by mouth in the morning, at noon, and at bedtime.     . Multiple Vitamin (MULTIVITAMIN WITH MINERALS) TABS tablet Take 2 tablets by mouth daily.     Marland Kitchen oxyCODONE-acetaminophen (PERCOCET) 10-325 MG tablet Take 1 tablet by mouth 4 (four) times daily as needed for pain.     . Cyanocobalamin (B-12 PO) Take 1 tablet by mouth daily. (Patient not taking: Reported on 11/29/2020)   Not Taking at Unknown time  . diclofenac sodium (VOLTAREN) 1 % GEL Apply 4 g topically 2 (two) times daily as needed (pain). (Patient not taking: Reported on 11/29/2020)   Not Taking at Unknown time  . MAGNESIUM PO Take 1 tablet by mouth daily. (Patient not taking: Reported on 11/29/2020)   Not Taking at Unknown time  . Melatonin 10 MG CAPS Take 10 mg by mouth at bedtime as needed (sleep). (Patient not taking: Reported on 11/29/2020)   Not Taking at Unknown time  . nystatin (MYCOSTATIN/NYSTOP) powder Apply 1 application topically daily as needed (rash). (Patient not taking: Reported on 11/29/2020)   Not Taking at Unknown time   Allergies  Allergen Reactions  . Banana Itching and Swelling    Mouth itchy and swelling - believes this may be seasonal  . Kiwi Extract Itching and Swelling    Mouth itchy and swelling  . Hydromorphone Itching  . Meperidine Itching    Social History   Tobacco Use  . Smoking status: Never Smoker  . Smokeless tobacco: Never Used  Substance Use Topics  . Alcohol use: No    Family History  Problem Relation Age of Onset  . COPD Mother      Review of Systems  Constitutional: Negative.   HENT: Negative.   Eyes: Negative.   Respiratory: Negative.    Cardiovascular: Negative.   Gastrointestinal: Negative.   Genitourinary: Negative.   Musculoskeletal: Positive for joint pain.  Skin: Negative.   Neurological: Negative.   Endo/Heme/Allergies: Negative.   Psychiatric/Behavioral: Positive for depression. The patient is nervous/anxious.       Objective:  Physical Exam Constitutional:      Appearance: She is well-developed.  HENT:     Head: Normocephalic.  Eyes:     Pupils: Pupils are equal, round, and reactive to light.  Neck:     Thyroid: No thyromegaly.     Vascular: No JVD.     Trachea: No tracheal deviation.  Cardiovascular:  Rate and Rhythm: Normal rate and regular rhythm.     Heart sounds: Murmur heard.    Pulmonary:     Effort: Pulmonary effort is normal. No respiratory distress.     Breath sounds: Normal breath sounds. No wheezing.  Abdominal:     Palpations: Abdomen is soft.     Tenderness: There is no abdominal tenderness. There is no guarding.  Musculoskeletal:     Cervical back: Neck supple.     Right knee: Swelling and bony tenderness present. No erythema or ecchymosis. Decreased range of motion. Tenderness present.  Lymphadenopathy:     Cervical: No cervical adenopathy.  Skin:    General: Skin is warm and dry.  Neurological:     Mental Status: She is alert and oriented to person, place, and time.      Labs:  Estimated body mass index is 37.8 kg/m as calculated from the following:   Height as of 12/02/20: 5\' 4"  (1.626 m).   Weight as of 12/02/20: 99.9 kg.  Imaging Review Plain radiographs demonstrate loosening of the right knee(s). The overall alignment is neutral.There is evidence of loosening of the tibial components. The bone quality appears to be good for age and reported activity level.     Assessment/Plan:  Right knee with failed previous arthroplasty.   The patient history, physical examination, clinical judgment of the provider and imaging studies are consistent with failure of the  right knee(s), previous total knee arthroplasty. Revision total knee arthroplasty is deemed medically necessary. The treatment options including medical management, injection therapy, arthroscopy and revision arthroplasty were discussed at length. The risks and benefits of revision total knee arthroplasty were presented and reviewed. The risks due to aseptic loosening, infection, stiffness, patella tracking problems, thromboembolic complications and other imponderables were discussed. The patient acknowledged the explanation, agreed to proceed with the plan and consent was signed. Patient is being admitted for treatment for surgery, pain control, PT, OT, prophylactic antibiotics, VTE prophylaxis, progressive ambulation and ADL's and discharge planning.The patient is planning to be discharged home.   02/01/21 Gerard Cantara   PA-C  12/06/2020, 8:32 AM

## 2020-12-07 NOTE — Progress Notes (Signed)
I spoke with Megan Robles to check on the results of her COVID test from 09/2020 from [facility], she stated she forgot to call them, but was going to call them now to ask them to fax the results to [place].  I spoke with Sherri at Dr. Nilsa Nutting office to make her aware that we have not received her COVID results.

## 2020-12-07 NOTE — Anesthesia Preprocedure Evaluation (Addendum)
Anesthesia Evaluation  Patient identified by MRN, date of birth, ID band Patient awake    Reviewed: Allergy & Precautions, NPO status , Patient's Chart, lab work & pertinent test results  Airway Mallampati: II  TM Distance: >3 FB Neck ROM: Full    Dental  (+) Teeth Intact   Pulmonary neg pulmonary ROS,    Pulmonary exam normal        Cardiovascular hypertension, Pt. on medications and Pt. on home beta blockers  Rhythm:Regular Rate:Normal     Neuro/Psych Anxiety Depression negative neurological ROS     GI/Hepatic negative GI ROS, Neg liver ROS,   Endo/Other  Morbid obesity  Renal/GU negative Renal ROS  negative genitourinary   Musculoskeletal  (+) Arthritis , Osteoarthritis,  Fibromyalgia -S/p TKR 2015 here for revision   Abdominal (+)  Abdomen: soft. Bowel sounds: normal.  Peds  Hematology  (+) anemia ,   Anesthesia Other Findings   Reproductive/Obstetrics                            Anesthesia Physical Anesthesia Plan  ASA: III  Anesthesia Plan: MAC, Regional and Spinal   Post-op Pain Management:  Regional for Post-op pain   Induction: Intravenous  PONV Risk Score and Plan: 2 and Ondansetron, Dexamethasone, Midazolam, Propofol infusion and Treatment may vary due to age or medical condition  Airway Management Planned: Simple Face Mask, Natural Airway and Nasal Cannula  Additional Equipment: None  Intra-op Plan:   Post-operative Plan:   Informed Consent: I have reviewed the patients History and Physical, chart, labs and discussed the procedure including the risks, benefits and alternatives for the proposed anesthesia with the patient or authorized representative who has indicated his/her understanding and acceptance.     Dental advisory given  Plan Discussed with: CRNA  Anesthesia Plan Comments: (- Patient canceled DOS by surgeon given COVID+ result.   Lab Results       Component                Value               Date                      WBC                      6.3                 12/02/2020                HGB                      13.3                12/02/2020                HCT                      41.5                12/02/2020                MCV                      92.4                12/02/2020  PLT                      309                 12/02/2020           Lab Results      Component                Value               Date                      NA                       139                 12/02/2020                K                        3.7                 12/02/2020                CO2                      23                  12/02/2020                GLUCOSE                  99                  12/02/2020                BUN                      12                  12/02/2020                CREATININE               0.62                12/02/2020                CALCIUM                  9.6                 12/02/2020                GFRNONAA                 >60                 12/02/2020                GFRAA                    >60                 12/08/2017          )       Anesthesia Quick Evaluation

## 2020-12-07 NOTE — Progress Notes (Signed)
Notified Ms. Kozlov of time change. Instructed to arrive at 0830. Verbalized understanding.

## 2020-12-08 ENCOUNTER — Encounter (HOSPITAL_COMMUNITY)
Admission: RE | Disposition: A | Discharge status: Home / Self Care | Payer: Self-pay | Source: Home / Self Care | Attending: Orthopedic Surgery

## 2020-12-08 ENCOUNTER — Ambulatory Visit (HOSPITAL_COMMUNITY)
Admission: RE | Admit: 2020-12-08 | Discharge: 2020-12-08 | Disposition: A | Discharge status: Home / Self Care | Payer: Medicare Other | Attending: Orthopedic Surgery | Admitting: Orthopedic Surgery

## 2020-12-08 ENCOUNTER — Encounter (HOSPITAL_COMMUNITY): Payer: Self-pay | Admitting: Orthopedic Surgery

## 2020-12-08 DIAGNOSIS — Z538 Procedure and treatment not carried out for other reasons: Secondary | ICD-10-CM | POA: Insufficient documentation

## 2020-12-08 DIAGNOSIS — T8484XA Pain due to internal orthopedic prosthetic devices, implants and grafts, initial encounter: Secondary | ICD-10-CM | POA: Diagnosis present

## 2020-12-08 DIAGNOSIS — Y838 Other surgical procedures as the cause of abnormal reaction of the patient, or of later complication, without mention of misadventure at the time of the procedure: Secondary | ICD-10-CM | POA: Insufficient documentation

## 2020-12-08 DIAGNOSIS — Z79899 Other long term (current) drug therapy: Secondary | ICD-10-CM | POA: Diagnosis not present

## 2020-12-08 DIAGNOSIS — U071 COVID-19: Secondary | ICD-10-CM | POA: Insufficient documentation

## 2020-12-08 LAB — TYPE AND SCREEN
ABO/RH(D): O POS
Antibody Screen: NEGATIVE

## 2020-12-08 LAB — SARS CORONAVIRUS 2 BY RT PCR (HOSPITAL ORDER, PERFORMED IN ~~LOC~~ HOSPITAL LAB): SARS Coronavirus 2: POSITIVE — AB

## 2020-12-08 SURGERY — TOTAL KNEE REVISION
Anesthesia: Monitor Anesthesia Care | Site: Knee | Laterality: Right

## 2020-12-08 MED ORDER — DEXAMETHASONE SODIUM PHOSPHATE 10 MG/ML IJ SOLN: 10.0000 mg | Freq: Once | INTRAMUSCULAR | Status: DC

## 2020-12-08 MED ORDER — FENTANYL CITRATE (PF) 100 MCG/2ML IJ SOLN
50.0000 ug | Freq: Once | INTRAMUSCULAR | Status: DC
  Filled 2020-12-08: qty 2

## 2020-12-08 MED ORDER — ONDANSETRON HCL 4 MG/2ML IJ SOLN
INTRAMUSCULAR | Status: AC
  Filled 2020-12-08: qty 2

## 2020-12-08 MED ORDER — MIDAZOLAM HCL 2 MG/2ML IJ SOLN
1.0000 mg | INTRAMUSCULAR | Status: DC
  Filled 2020-12-08: qty 2

## 2020-12-08 MED ORDER — DEXAMETHASONE SODIUM PHOSPHATE 10 MG/ML IJ SOLN
INTRAMUSCULAR | Status: AC
  Filled 2020-12-08: qty 1

## 2020-12-08 MED ORDER — ORAL CARE MOUTH RINSE: 15.0000 mL | Freq: Once | OROMUCOSAL | Status: AC

## 2020-12-08 MED ORDER — CEFAZOLIN SODIUM-DEXTROSE 2-4 GM/100ML-% IV SOLN
2.0000 g | INTRAVENOUS | Status: DC
Start: 2020-12-08 — End: 2020-12-08
  Filled 2020-12-08: qty 100

## 2020-12-08 MED ORDER — LACTATED RINGERS IV SOLN: INTRAVENOUS | Status: DC

## 2020-12-08 MED ORDER — CHLORHEXIDINE GLUCONATE 0.12 % MT SOLN
15.0000 mL | Freq: Once | OROMUCOSAL | Status: AC
  Administered 2020-12-08: 15 mL via OROMUCOSAL

## 2020-12-08 MED ORDER — TRANEXAMIC ACID-NACL 1000-0.7 MG/100ML-% IV SOLN
1000.0000 mg | INTRAVENOUS | Status: DC
  Filled 2020-12-08: qty 100

## 2020-12-08 MED ORDER — LIDOCAINE 2% (20 MG/ML) 5 ML SYRINGE
INTRAMUSCULAR | Status: AC
  Filled 2020-12-08: qty 5

## 2020-12-08 MED ORDER — PROPOFOL 1000 MG/100ML IV EMUL
INTRAVENOUS | Status: AC
  Filled 2020-12-08: qty 100

## 2020-12-08 MED ORDER — POVIDONE-IODINE 10 % EX SWAB
2.0000 "application " | Freq: Once | CUTANEOUS | Status: AC
  Administered 2020-12-08: 2 via TOPICAL

## 2020-12-08 NOTE — Progress Notes (Signed)
Preop +covid, case cancelled per Dr Charlann Boxer, to be resceduled.  Pt discharged ambulating out to meet daughter and verbalizes understandingto call office to reschedule.

## 2020-12-08 NOTE — Interval H&P Note (Signed)
History and Physical Interval Note:  12/08/2020 9:07 AM  Megan Robles  has presented today for surgery, with the diagnosis of Failed Right total knee arthroplasty.  The various methods of treatment have been discussed with the patient and family. After consideration of risks, benefits and other options for treatment, the patient has consented to  Procedure(s) with comments: TOTAL KNEE REVISION (Right) - 90 min as a surgical intervention.  The patient's history has been reviewed, patient examined, no change in status, stable for surgery.  I have reviewed the patient's chart and labs.  Questions were answered to the patient's satisfaction.     [name]

## 2020-12-09 ENCOUNTER — Telehealth (HOSPITAL_COMMUNITY): Payer: Self-pay

## 2020-12-09 NOTE — Telephone Encounter (Signed)
Called to Discuss with patient about Covid symptoms and the use of the monoclonal antibody infusion for those with mild to moderate Covid symptoms and at a high risk of hospitalization.     Pt is not qualified for this infusion, pt is asymptomatic.   Patient Active Problem List   Diagnosis Date Noted  . Psychosis (HCC)   . UTI (urinary tract infection) 04/16/2014  . 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 Expected blood loss anemia 01/28/2014  . Morbid obesity (HCC) 01/28/2014  . S/P total hip arthroplasty 01/26/2014    Patient declines infusion at this time. Symptoms tier reviewed as well as criteria for ending isolation. Preventative practices reviewed. Patient verbalized understanding.    Patient advised to call back if he/she decides that he/she does want to get infusion. Callback number to the infusion center given. Patient advised to go to Urgent care or ED with severe symptoms.

## 2020-12-29 ENCOUNTER — Other Ambulatory Visit (HOSPITAL_COMMUNITY): Payer: Self-pay

## 2020-12-30 ENCOUNTER — Other Ambulatory Visit: Payer: Self-pay

## 2020-12-30 ENCOUNTER — Encounter (HOSPITAL_COMMUNITY): Payer: Self-pay | Admitting: Orthopedic Surgery

## 2021-01-02 NOTE — H&P (Signed)
TOTAL KNEE REVISION ADMISSION H&P  Patient is being admitted for right revision total knee arthroplasty.  Subjective:  Chief Complaint:right knee pain.  HPI: Megan Robles, 50 y.o. female, has a history of pain and functional disability in the right knee(s) due to failed previous arthroplasty and patient has failed non-surgical conservative treatments for greater than 12 weeks to include NSAID's and/or analgesics, weight reduction as appropriate and activity modification. The indications for the revision of the total knee arthroplasty are instability and pain . Onset of symptoms was gradual starting 1 years ago with gradually worsening course since that time.  Prior procedures on the right knee(s) include total knee arthroplasty.  Patient currently rates pain in the right knee(s) at 8 out of 10 with activity. There is worsening of pain with activity and weight bearing and pain that interferes with activities of daily living.  Patient has evidence of stable femoral and tibial components by imaging studies. This condition presents safety issues increasing the risk of falls.There is no current active infection.  Patient Active Problem List   Diagnosis Date Noted  . Psychosis (HCC)   . UTI (urinary tract infection) 04/16/2014  . 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 Expected blood loss anemia 01/28/2014  . Morbid obesity (HCC) 01/28/2014  . S/P total hip arthroplasty 01/26/2014   Past Medical History:  Diagnosis Date  . Anemia 2004  . Anxiety   . Arthritis   . Depression   . Fibromyalgia   . Heart murmur   . History of blood transfusion 2004/2015  . Hypertension   . Pneumonia 2004  . URI (upper respiratory infection) 03/19/14   productive cough with yellow nasal discharge with fever- did not seek treatment- 7/17/15states resolved except occ cough now    Past Surgical History:  Procedure Laterality Date  . ABDOMINAL HYSTERECTOMY  2016  . CHOLECYSTECTOMY  2004  . COLONOSCOPY  02/15/2012    Procedure: COLONOSCOPY;  Surgeon: Theda Belfast, MD;  Location: [facility];  Service: Endoscopy;  Laterality: N/A;  . DILATION AND CURETTAGE OF UTERUS  2004  . ESOPHAGOGASTRODUODENOSCOPY  02/15/2012   Procedure: ESOPHAGOGASTRODUODENOSCOPY (EGD);  Surgeon: Theda Belfast, MD;  Location: [facility];  Service: Endoscopy;  Laterality: N/A;  . LAPAROSCOPIC GASTRIC SLEEVE RESECTION  2016  . TOTAL HIP ARTHROPLASTY Left 01/26/2014   Procedure: LEFT TOTAL HIP ARTHROPLASTY ANTERIOR APPROACH;  Surgeon: [name], MD;  Location: [facility];  Service: Orthopedics;  Laterality: Left;  . TOTAL KNEE ARTHROPLASTY Right 04/13/2014   Procedure: RIGHT TOTAL KNEE ARTHROPLASTY;  Surgeon: [name], MD;  Location: [facility];  Service: Orthopedics;  Laterality: Right;    No current facility-administered medications for this encounter.   Current Outpatient Medications  Medication Sig Dispense Refill Last Dose  . albuterol (VENTOLIN HFA) 108 (90 Base) MCG/ACT inhaler Inhale 1-2 puffs into the lungs every 6 (six) hours as needed for wheezing or shortness of breath.     . Ascorbic Acid (VITAMIN C PO) Take 2 tablets by mouth in the morning.     Marland Kitchen BIOTIN PO Take 1 tablet by mouth in the morning.     . Biotin w/ Vitamins C & E (HAIR/SKIN/NAILS PO) Take 1 tablet by mouth in the morning.     Marland Kitchen CALCIUM PO Take 1 tablet by mouth in the morning.     . carvedilol (COREG) 6.25 MG tablet Take 6.25 mg by mouth 2 (two) times daily.     . cholecalciferol (VITAMIN D3) 25 MCG (  1000 UNIT) tablet Take 1,000 Units by mouth daily.     . Cyanocobalamin (B-12 PO) Take 1 tablet by mouth in the morning.     . diclofenac sodium (VOLTAREN) 1 % GEL Apply 4 g topically 2 (two) times daily as needed (pain).     . hydrochlorothiazide (HYDRODIURIL) 25 MG tablet Take 25 mg by mouth in the morning.     Marland Kitchen lisinopril (PRINIVIL,ZESTRIL) 10 MG tablet Take 10 mg by mouth in the morning.     . Magnesium 200 MG TABS Take 400 mg by mouth in the morning,  at noon, and at bedtime.     . Melatonin 10 MG CAPS Take 10 mg by mouth at bedtime as needed (sleep).     . Multiple Vitamin (MULTIVITAMIN WITH MINERALS) TABS tablet Take 2 tablets by mouth in the morning.     Marland Kitchen oxyCODONE-acetaminophen (PERCOCET) 10-325 MG tablet Take 1 tablet by mouth 4 (four) times daily as needed for pain.      Allergies  Allergen Reactions  . Kiwi Extract Itching and Swelling    Mouth itchy and swelling  . Hydromorphone Itching  . Meperidine Itching    Social History   Tobacco Use  . Smoking status: Never Smoker  . Smokeless tobacco: Never Used  Substance Use Topics  . Alcohol use: No    Family History  Problem Relation Age of Onset  . COPD Mother       Review of Systems  Constitutional: Negative for chills and fever.  Respiratory: Negative for cough and shortness of breath.   Cardiovascular: Negative for chest pain.  Gastrointestinal: Negative for nausea and vomiting.  Musculoskeletal: Positive for arthralgias.     Objective:  Physical Exam   Well nourished and well developed. General: Alert and oriented x3, cooperative and pleasant, no acute distress. Head: normocephalic, atraumatic, neck supple. Eyes: EOMI.  Musculoskeletal:  Right Hip Exam: No pain with passive or active hip ROM. Left Hip Exam: Mild groin and lateral hip pain with passive hip ROM. Tender to palpation about the greater trochanteric bursa.  Right knee exam: Her incision is healed. She does not have any effusion or warmth. Examination of her ligaments reveals that she does have some passive hyperextension and noted plate medially and laterally as well as anterior to posterior. She is neurovascular intact distally without lower extremity edema or erythema  Calves soft and nontender. Motor function intact in LE. Strength 5/5 LE bilaterally. Neuro: Distal pulses 2+. Sensation to light touch intact in LE.  Vital signs in last 24 hours:    Labs:  Estimated body mass index  is 37.8 kg/m as calculated from the following:   Height as of 12/02/20: 5\' 4"  (1.626 m).   Weight as of 12/02/20: 99.9 kg.  Imaging Review Plain radiographs demonstrate stable femoral and tibial components appear to be well fixed with good cement mantle no evidence of any lucency. No evidence of any acute injuries.  Assessment/Plan:  End stage arthritis, right knee(s) with failed previous arthroplasty.   The patient history, physical examination, clinical judgment of the provider and imaging studies are consistent with end stage degenerative joint disease of the right knee(s), previous total knee arthroplasty. Revision total knee arthroplasty is deemed medically necessary. The treatment options including medical management, injection therapy, arthroscopy and revision arthroplasty were discussed at length. The risks and benefits of revision total knee arthroplasty were presented and reviewed. The risks due to aseptic loosening, infection, stiffness, patella tracking problems, thromboembolic complications and  other imponderables were discussed. The patient acknowledged the explanation, agreed to proceed with the plan and consent was signed. Patient is being admitted for inpatient treatment for surgery, pain control, PT, OT, prophylactic antibiotics, VTE prophylaxis, progressive ambulation and ADL's and discharge planning.The patient is planning to be discharged home.   Therapy Plans: outpatient therapy at [facility] Disposition: Home with son Planned DVT Prophylaxis: Xarelto 10mg  daily DME needed: none PCP: Dr. , clearance received TXA: IV Allergies: banana - itching, Dilaudid - itching, throat swelling, meperidine - itching Anesthesia Concerns: none BMI: 44.6 Not diabetic.   Other: - Takes Percocet 10-325 1 tablets q3h - Dr. Paulina Fusi may manage after surgery  Paulina Fusi, PA-C Orthopedic Surgery [facility] [place] [phone]

## 2021-01-05 ENCOUNTER — Encounter (HOSPITAL_COMMUNITY)
Admission: RE | Disposition: A | Discharge status: Home / Self Care | Payer: Self-pay | Source: Home / Self Care | Attending: Orthopedic Surgery

## 2021-01-05 ENCOUNTER — Inpatient Hospital Stay (HOSPITAL_COMMUNITY): Payer: Medicare Other | Admitting: Anesthesiology

## 2021-01-05 ENCOUNTER — Encounter (HOSPITAL_COMMUNITY): Payer: Self-pay | Admitting: Orthopedic Surgery

## 2021-01-05 ENCOUNTER — Other Ambulatory Visit: Payer: Self-pay

## 2021-01-05 ENCOUNTER — Inpatient Hospital Stay (HOSPITAL_COMMUNITY)
Admission: RE | Admit: 2021-01-05 | Discharge: 2021-01-07 | DRG: 464 | Disposition: A | Discharge status: Home / Self Care | Payer: Medicare Other | Attending: Orthopedic Surgery | Admitting: Orthopedic Surgery

## 2021-01-05 DIAGNOSIS — Z96642 Presence of left artificial hip joint: Secondary | ICD-10-CM | POA: Diagnosis present

## 2021-01-05 DIAGNOSIS — R112 Nausea with vomiting, unspecified: Secondary | ICD-10-CM | POA: Diagnosis not present

## 2021-01-05 DIAGNOSIS — F419 Anxiety disorder, unspecified: Secondary | ICD-10-CM | POA: Diagnosis present

## 2021-01-05 DIAGNOSIS — Z8616 Personal history of COVID-19: Secondary | ICD-10-CM

## 2021-01-05 DIAGNOSIS — Z825 Family history of asthma and other chronic lower respiratory diseases: Secondary | ICD-10-CM | POA: Diagnosis not present

## 2021-01-05 DIAGNOSIS — Z79899 Other long term (current) drug therapy: Secondary | ICD-10-CM | POA: Diagnosis not present

## 2021-01-05 DIAGNOSIS — Y792 Prosthetic and other implants, materials and accessory orthopedic devices associated with adverse incidents: Secondary | ICD-10-CM | POA: Diagnosis present

## 2021-01-05 DIAGNOSIS — Z885 Allergy status to narcotic agent status: Secondary | ICD-10-CM

## 2021-01-05 DIAGNOSIS — D649 Anemia, unspecified: Secondary | ICD-10-CM | POA: Diagnosis present

## 2021-01-05 DIAGNOSIS — Z888 Allergy status to other drugs, medicaments and biological substances status: Secondary | ICD-10-CM

## 2021-01-05 DIAGNOSIS — I10 Essential (primary) hypertension: Secondary | ICD-10-CM | POA: Diagnosis present

## 2021-01-05 DIAGNOSIS — T84092A Other mechanical complication of internal right knee prosthesis, initial encounter: Secondary | ICD-10-CM | POA: Diagnosis present

## 2021-01-05 DIAGNOSIS — R011 Cardiac murmur, unspecified: Secondary | ICD-10-CM | POA: Diagnosis present

## 2021-01-05 DIAGNOSIS — M797 Fibromyalgia: Secondary | ICD-10-CM | POA: Diagnosis present

## 2021-01-05 DIAGNOSIS — Z6841 Body Mass Index (BMI) 40.0 and over, adult: Secondary | ICD-10-CM

## 2021-01-05 DIAGNOSIS — F32A Depression, unspecified: Secondary | ICD-10-CM | POA: Diagnosis present

## 2021-01-05 DIAGNOSIS — T84022A Instability of internal right knee prosthesis, initial encounter: Secondary | ICD-10-CM | POA: Diagnosis present

## 2021-01-05 DIAGNOSIS — Z96651 Presence of right artificial knee joint: Secondary | ICD-10-CM

## 2021-01-05 HISTORY — PX: TOTAL KNEE REVISION: SHX996

## 2021-01-05 LAB — COMPREHENSIVE METABOLIC PANEL
ALT: 15 U/L (ref 0–44)
AST: 16 U/L (ref 15–41)
Albumin: 3.4 g/dL — ABNORMAL LOW (ref 3.5–5.0)
Alkaline Phosphatase: 55 U/L (ref 38–126)
Anion gap: 3 — ABNORMAL LOW (ref 5–15)
BUN: 13 mg/dL (ref 6–20)
CO2: 29 mmol/L (ref 22–32)
Calcium: 8.8 mg/dL — ABNORMAL LOW (ref 8.9–10.3)
Chloride: 108 mmol/L (ref 98–111)
Creatinine, Ser: 0.65 mg/dL (ref 0.44–1.00)
GFR, Estimated: >60 mL/min (ref >60)
Glucose, Bld: 97 mg/dL (ref 70–99)
Potassium: 3.5 mmol/L (ref 3.5–5.1)
Sodium: 140 mmol/L (ref 135–145)
Total Bilirubin: 0.5 mg/dL (ref 0.3–1.2)
Total Protein: 6.3 g/dL — ABNORMAL LOW (ref 6.5–8.1)

## 2021-01-05 LAB — CBC
HCT: 38.2 % (ref 36.0–46.0)
Hemoglobin: 12.3 g/dL (ref 12.0–15.0)
MCH: 29.6 pg (ref 26.0–34.0)
MCHC: 32.2 g/dL (ref 30.0–36.0)
MCV: 92 fL (ref 80.0–100.0)
Platelets: 259 10*3/uL (ref 150–400)
RBC: 4.15 MIL/uL (ref 3.87–5.11)
RDW: 13.5 % (ref 11.5–15.5)
WBC: 5 10*3/uL (ref 4.0–10.5)
nRBC: 0 % (ref 0.0–0.2)

## 2021-01-05 LAB — TYPE AND SCREEN
ABO/RH(D): O POS
Antibody Screen: NEGATIVE

## 2021-01-05 LAB — APTT: aPTT: 32 seconds (ref 24–36)

## 2021-01-05 LAB — PROTIME-INR
INR: 1 (ref 0.8–1.2)
Prothrombin Time: 12.9 seconds (ref 11.4–15.2)

## 2021-01-05 SURGERY — TOTAL KNEE REVISION
Anesthesia: Spinal | Site: Knee | Laterality: Right

## 2021-01-05 MED ORDER — METHOCARBAMOL 500 MG IVPB - SIMPLE MED
500.0000 mg | Freq: Four times a day (QID) | INTRAVENOUS | Status: DC | PRN
Start: 2021-01-05 — End: 2021-01-07
  Filled 2021-01-05: qty 50

## 2021-01-05 MED ORDER — ACETAMINOPHEN 325 MG PO TABS: 325.0000 mg | ORAL_TABLET | Freq: Four times a day (QID) | ORAL | Status: DC | PRN

## 2021-01-05 MED ORDER — MORPHINE SULFATE (PF) 2 MG/ML IV SOLN
0.5000 mg | INTRAVENOUS | Status: DC | PRN
  Administered 2021-01-05: 1 mg via INTRAVENOUS
  Filled 2021-01-05: qty 1

## 2021-01-05 MED ORDER — PROPOFOL 500 MG/50ML IV EMUL
INTRAVENOUS | Status: AC
  Filled 2021-01-05: qty 50

## 2021-01-05 MED ORDER — POVIDONE-IODINE 10 % EX SWAB
2.0000 "application " | Freq: Once | CUTANEOUS | Status: AC
  Administered 2021-01-05: 2 via TOPICAL

## 2021-01-05 MED ORDER — SODIUM CHLORIDE (PF) 0.9 % IJ SOLN
INTRAMUSCULAR | Status: AC
  Filled 2021-01-05: qty 30

## 2021-01-05 MED ORDER — METOCLOPRAMIDE HCL 5 MG PO TABS: 5.0000 mg | ORAL_TABLET | Freq: Three times a day (TID) | ORAL | Status: DC | PRN

## 2021-01-05 MED ORDER — METOCLOPRAMIDE HCL 5 MG/ML IJ SOLN: 5.0000 mg | Freq: Three times a day (TID) | INTRAMUSCULAR | Status: DC | PRN

## 2021-01-05 MED ORDER — TRANEXAMIC ACID-NACL 1000-0.7 MG/100ML-% IV SOLN
1000.0000 mg | INTRAVENOUS | Status: AC
  Administered 2021-01-05: 1000 mg via INTRAVENOUS
  Filled 2021-01-05: qty 100

## 2021-01-05 MED ORDER — OXYCODONE HCL 5 MG PO TABS
5.0000 mg | ORAL_TABLET | ORAL | Status: DC | PRN
  Administered 2021-01-06 (×2): 10 mg via ORAL
  Filled 2021-01-05 (×2): qty 2

## 2021-01-05 MED ORDER — MIDAZOLAM HCL 2 MG/2ML IJ SOLN
INTRAMUSCULAR | Status: AC
  Filled 2021-01-05: qty 2

## 2021-01-05 MED ORDER — BUPIVACAINE-EPINEPHRINE (PF) 0.25% -1:200000 IJ SOLN
INTRAMUSCULAR | Status: AC
  Filled 2021-01-05: qty 30

## 2021-01-05 MED ORDER — DEXAMETHASONE SODIUM PHOSPHATE 10 MG/ML IJ SOLN
INTRAMUSCULAR | Status: AC
  Filled 2021-01-05: qty 1

## 2021-01-05 MED ORDER — CARVEDILOL 6.25 MG PO TABS
6.2500 mg | ORAL_TABLET | Freq: Two times a day (BID) | ORAL | Status: DC
  Administered 2021-01-05 – 2021-01-07 (×3): 6.25 mg via ORAL
  Filled 2021-01-05 (×4): qty 1

## 2021-01-05 MED ORDER — BUPIVACAINE IN DEXTROSE 0.75-8.25 % IT SOLN
INTRATHECAL | Status: DC | PRN
  Administered 2021-01-05: 1.6 mL via INTRATHECAL

## 2021-01-05 MED ORDER — BISACODYL 10 MG RE SUPP: 10.0000 mg | Freq: Every day | RECTAL | Status: DC | PRN

## 2021-01-05 MED ORDER — KETOROLAC TROMETHAMINE 30 MG/ML IJ SOLN: 30.0000 mg | Freq: Once | INTRAMUSCULAR | Status: DC | PRN

## 2021-01-05 MED ORDER — MEPERIDINE HCL 50 MG/ML IJ SOLN: 6.2500 mg | INTRAMUSCULAR | Status: DC | PRN

## 2021-01-05 MED ORDER — ONDANSETRON HCL 4 MG/2ML IJ SOLN
4.0000 mg | Freq: Four times a day (QID) | INTRAMUSCULAR | Status: DC | PRN
  Administered 2021-01-06: 4 mg via INTRAVENOUS
  Filled 2021-01-05: qty 2

## 2021-01-05 MED ORDER — CEFAZOLIN SODIUM-DEXTROSE 2-4 GM/100ML-% IV SOLN
2.0000 g | INTRAVENOUS | Status: AC
  Administered 2021-01-05: 2 g via INTRAVENOUS
  Filled 2021-01-05: qty 100

## 2021-01-05 MED ORDER — PHENOL 1.4 % MT LIQD: 1.0000 | OROMUCOSAL | Status: DC | PRN

## 2021-01-05 MED ORDER — KETOROLAC TROMETHAMINE 30 MG/ML IJ SOLN
INTRAMUSCULAR | Status: DC | PRN
  Administered 2021-01-05: 30 mg

## 2021-01-05 MED ORDER — LACTATED RINGERS IV SOLN: INTRAVENOUS | Status: DC

## 2021-01-05 MED ORDER — FENTANYL CITRATE (PF) 100 MCG/2ML IJ SOLN
50.0000 ug | Freq: Once | INTRAMUSCULAR | Status: AC
  Administered 2021-01-05: 100 ug via INTRAVENOUS
  Filled 2021-01-05: qty 2

## 2021-01-05 MED ORDER — ONDANSETRON HCL 4 MG/2ML IJ SOLN
INTRAMUSCULAR | Status: DC | PRN
  Administered 2021-01-05: 4 mg via INTRAVENOUS

## 2021-01-05 MED ORDER — ONDANSETRON HCL 4 MG/2ML IJ SOLN
INTRAMUSCULAR | Status: AC
  Filled 2021-01-05: qty 2

## 2021-01-05 MED ORDER — DIPHENHYDRAMINE HCL 12.5 MG/5ML PO ELIX: 12.5000 mg | ORAL_SOLUTION | ORAL | Status: DC | PRN

## 2021-01-05 MED ORDER — LIDOCAINE 2% (20 MG/ML) 5 ML SYRINGE
INTRAMUSCULAR | Status: AC
  Filled 2021-01-05: qty 5

## 2021-01-05 MED ORDER — METHOCARBAMOL 500 MG PO TABS
500.0000 mg | ORAL_TABLET | Freq: Four times a day (QID) | ORAL | Status: DC | PRN
  Administered 2021-01-05 – 2021-01-07 (×7): 500 mg via ORAL
  Filled 2021-01-05 (×7): qty 1

## 2021-01-05 MED ORDER — DEXAMETHASONE SODIUM PHOSPHATE 10 MG/ML IJ SOLN
8.0000 mg | Freq: Once | INTRAMUSCULAR | Status: AC
  Administered 2021-01-05: 8 mg via INTRAVENOUS

## 2021-01-05 MED ORDER — SODIUM CHLORIDE 0.9 % IV SOLN: INTRAVENOUS | Status: DC

## 2021-01-05 MED ORDER — SODIUM CHLORIDE 0.9 % IR SOLN
Status: DC | PRN
  Administered 2021-01-05: 1000 mL
  Administered 2021-01-05: 3000 mL

## 2021-01-05 MED ORDER — PROMETHAZINE HCL 25 MG/ML IJ SOLN: 6.2500 mg | INTRAMUSCULAR | Status: DC | PRN

## 2021-01-05 MED ORDER — BUPIVACAINE-EPINEPHRINE 0.25% -1:200000 IJ SOLN
INTRAMUSCULAR | Status: DC | PRN
  Administered 2021-01-05: 30 mL

## 2021-01-05 MED ORDER — DOCUSATE SODIUM 100 MG PO CAPS
100.0000 mg | ORAL_CAPSULE | Freq: Two times a day (BID) | ORAL | Status: DC
  Administered 2021-01-05 – 2021-01-07 (×4): 100 mg via ORAL
  Filled 2021-01-05 (×4): qty 1

## 2021-01-05 MED ORDER — LIDOCAINE 2% (20 MG/ML) 5 ML SYRINGE
INTRAMUSCULAR | Status: DC | PRN
  Administered 2021-01-05: 100 mg via INTRAVENOUS

## 2021-01-05 MED ORDER — MENTHOL 3 MG MT LOZG: 1.0000 | LOZENGE | OROMUCOSAL | Status: DC | PRN

## 2021-01-05 MED ORDER — PROPOFOL 10 MG/ML IV BOLUS
INTRAVENOUS | Status: AC
  Filled 2021-01-05: qty 20

## 2021-01-05 MED ORDER — CHLORHEXIDINE GLUCONATE 0.12 % MT SOLN: 15.0000 mL | Freq: Once | OROMUCOSAL | Status: AC

## 2021-01-05 MED ORDER — LISINOPRIL 10 MG PO TABS
10.0000 mg | ORAL_TABLET | Freq: Every day | ORAL | Status: DC
  Administered 2021-01-06 – 2021-01-07 (×2): 10 mg via ORAL
  Filled 2021-01-05 (×2): qty 1

## 2021-01-05 MED ORDER — ALBUTEROL SULFATE HFA 108 (90 BASE) MCG/ACT IN AERS: 1.0000 | INHALATION_SPRAY | Freq: Four times a day (QID) | RESPIRATORY_TRACT | Status: DC | PRN

## 2021-01-05 MED ORDER — PROPOFOL 10 MG/ML IV BOLUS
INTRAVENOUS | Status: DC | PRN
  Administered 2021-01-05: 30 mg via INTRAVENOUS

## 2021-01-05 MED ORDER — TRANEXAMIC ACID-NACL 1000-0.7 MG/100ML-% IV SOLN
1000.0000 mg | Freq: Once | INTRAVENOUS | Status: AC
  Administered 2021-01-05: 1000 mg via INTRAVENOUS
  Filled 2021-01-05: qty 100

## 2021-01-05 MED ORDER — POLYETHYLENE GLYCOL 3350 17 G PO PACK: 17.0000 g | PACK | Freq: Every day | ORAL | Status: DC | PRN

## 2021-01-05 MED ORDER — FERROUS SULFATE 325 (65 FE) MG PO TABS
325.0000 mg | ORAL_TABLET | Freq: Three times a day (TID) | ORAL | Status: DC
  Administered 2021-01-06 – 2021-01-07 (×2): 325 mg via ORAL
  Filled 2021-01-05 (×2): qty 1

## 2021-01-05 MED ORDER — CELECOXIB 200 MG PO CAPS
200.0000 mg | ORAL_CAPSULE | Freq: Two times a day (BID) | ORAL | Status: DC
  Administered 2021-01-05 – 2021-01-07 (×4): 200 mg via ORAL
  Filled 2021-01-05 (×5): qty 1

## 2021-01-05 MED ORDER — PROPOFOL 500 MG/50ML IV EMUL
INTRAVENOUS | Status: DC | PRN
  Administered 2021-01-05: 75 ug/kg/min via INTRAVENOUS

## 2021-01-05 MED ORDER — MIDAZOLAM HCL 2 MG/2ML IJ SOLN
1.0000 mg | Freq: Once | INTRAMUSCULAR | Status: AC
  Administered 2021-01-05: 2 mg via INTRAVENOUS
  Filled 2021-01-05: qty 2

## 2021-01-05 MED ORDER — CEFAZOLIN SODIUM-DEXTROSE 2-4 GM/100ML-% IV SOLN
2.0000 g | Freq: Four times a day (QID) | INTRAVENOUS | Status: AC
  Administered 2021-01-05 (×2): 2 g via INTRAVENOUS
  Filled 2021-01-05 (×2): qty 100

## 2021-01-05 MED ORDER — HYDROMORPHONE HCL 1 MG/ML IJ SOLN: 0.2500 mg | INTRAMUSCULAR | Status: DC | PRN

## 2021-01-05 MED ORDER — DEXAMETHASONE SODIUM PHOSPHATE 10 MG/ML IJ SOLN
10.0000 mg | Freq: Once | INTRAMUSCULAR | Status: AC
  Administered 2021-01-06: 10 mg via INTRAVENOUS
  Filled 2021-01-05: qty 1

## 2021-01-05 MED ORDER — VANCOMYCIN HCL 1000 MG IV SOLR
INTRAVENOUS | Status: AC
  Filled 2021-01-05: qty 2000

## 2021-01-05 MED ORDER — MIDAZOLAM HCL 5 MG/5ML IJ SOLN
INTRAMUSCULAR | Status: DC | PRN
  Administered 2021-01-05 (×2): 1 mg via INTRAVENOUS

## 2021-01-05 MED ORDER — HYDROCHLOROTHIAZIDE 25 MG PO TABS
25.0000 mg | ORAL_TABLET | Freq: Every day | ORAL | Status: DC
  Administered 2021-01-06 – 2021-01-07 (×2): 25 mg via ORAL
  Filled 2021-01-05 (×2): qty 1

## 2021-01-05 MED ORDER — ASPIRIN 81 MG PO CHEW
81.0000 mg | CHEWABLE_TABLET | Freq: Two times a day (BID) | ORAL | Status: DC
  Administered 2021-01-05 – 2021-01-07 (×4): 81 mg via ORAL
  Filled 2021-01-05 (×4): qty 1

## 2021-01-05 MED ORDER — KETOROLAC TROMETHAMINE 30 MG/ML IJ SOLN
INTRAMUSCULAR | Status: AC
  Filled 2021-01-05: qty 1

## 2021-01-05 MED ORDER — OXYCODONE HCL 5 MG PO TABS
10.0000 mg | ORAL_TABLET | ORAL | Status: DC | PRN
  Administered 2021-01-05 – 2021-01-07 (×9): 15 mg via ORAL
  Filled 2021-01-05 (×9): qty 3

## 2021-01-05 MED ORDER — ONDANSETRON HCL 4 MG PO TABS: 4.0000 mg | ORAL_TABLET | Freq: Four times a day (QID) | ORAL | Status: DC | PRN

## 2021-01-05 MED ORDER — SODIUM CHLORIDE (PF) 0.9 % IJ SOLN
INTRAMUSCULAR | Status: DC | PRN
  Administered 2021-01-05: 30 mL

## 2021-01-05 MED ORDER — ORAL CARE MOUTH RINSE
15.0000 mL | Freq: Once | OROMUCOSAL | Status: AC
  Administered 2021-01-05: 15 mL via OROMUCOSAL

## 2021-01-05 SURGICAL SUPPLY — 57 items
BAG DECANTER FOR FLEXI CONT (MISCELLANEOUS) IMPLANT
BAG ZIPLOCK 12X15 (MISCELLANEOUS) ×2 IMPLANT
BLADE SAW SGTL 11.0X1.19X90.0M (BLADE) ×2 IMPLANT
BLADE SAW SGTL 13.0X1.19X90.0M (BLADE) ×2 IMPLANT
BLADE SAW SGTL 81X20 HD (BLADE) ×2 IMPLANT
BLADE SURG SZ10 CARB STEEL (BLADE) ×4 IMPLANT
BNDG ELASTIC 6X5.8 VLCR STR LF (GAUZE/BANDAGES/DRESSINGS) ×2 IMPLANT
BRUSH FEMORAL CANAL (MISCELLANEOUS) ×2 IMPLANT
COVER SURGICAL LIGHT HANDLE (MISCELLANEOUS) ×2 IMPLANT
COVER WAND RF STERILE (DRAPES) IMPLANT
CUFF TOURN SGL QUICK 34 (TOURNIQUET CUFF) ×1
CUFF TRNQT CYL 34X4.125X (TOURNIQUET CUFF) ×1 IMPLANT
DECANTER SPIKE VIAL GLASS SM (MISCELLANEOUS) IMPLANT
DERMABOND ADVANCED (GAUZE/BANDAGES/DRESSINGS) ×1
DERMABOND ADVANCED .7 DNX12 (GAUZE/BANDAGES/DRESSINGS) ×1 IMPLANT
DRAPE U-SHAPE 47X51 STRL (DRAPES) ×2 IMPLANT
DRESSING AQUACEL AG SP 3.5X10 (GAUZE/BANDAGES/DRESSINGS) IMPLANT
DRSG AQUACEL AG ADV 3.5X10 (GAUZE/BANDAGES/DRESSINGS) ×2 IMPLANT
DRSG AQUACEL AG ADV 3.5X14 (GAUZE/BANDAGES/DRESSINGS) ×2 IMPLANT
DRSG AQUACEL AG SP 3.5X10 (GAUZE/BANDAGES/DRESSINGS)
DURAPREP 26ML APPLICATOR (WOUND CARE) ×4 IMPLANT
ELECT REM PT RETURN 15FT ADLT (MISCELLANEOUS) ×2 IMPLANT
GAUZE SPONGE 2X2 8PLY STRL LF (GAUZE/BANDAGES/DRESSINGS) IMPLANT
GLOVE ORTHO TXT STRL SZ7.5 (GLOVE) ×2 IMPLANT
GLOVE SURG UNDER POLY LF SZ7.5 (GLOVE) ×2 IMPLANT
GOWN STRL REUS W/TWL LRG LVL3 (GOWN DISPOSABLE) ×2 IMPLANT
HANDPIECE INTERPULSE COAX TIP (DISPOSABLE) ×1
HOLDER FOLEY CATH W/STRAP (MISCELLANEOUS) ×2 IMPLANT
IRRIGATION SURGIPHOR STRL (IV SOLUTION) IMPLANT
KIT TURNOVER KIT A (KITS) ×2 IMPLANT
MANIFOLD NEPTUNE II (INSTRUMENTS) ×2 IMPLANT
NDL SAFETY ECLIPSE 18X1.5 (NEEDLE) ×1 IMPLANT
NEEDLE HYPO 18GX1.5 SHARP (NEEDLE) ×1
NS IRRIG 1000ML POUR BTL (IV SOLUTION) ×2 IMPLANT
PACK TOTAL KNEE CUSTOM (KITS) ×2 IMPLANT
PENCIL SMOKE EVACUATOR (MISCELLANEOUS) ×2 IMPLANT
PLATE TIB INSR RO 2.5X22.5 KNE (Insert) ×1 IMPLANT
PROTECTOR NERVE ULNAR (MISCELLANEOUS) ×2 IMPLANT
SET HNDPC FAN SPRY TIP SCT (DISPOSABLE) ×1 IMPLANT
SET PAD KNEE POSITIONER (MISCELLANEOUS) ×2 IMPLANT
SPONGE GAUZE 2X2 STER 10/PKG (GAUZE/BANDAGES/DRESSINGS)
STAPLER VISISTAT 35W (STAPLE) IMPLANT
SUT MNCRL AB 3-0 PS2 18 (SUTURE) ×2 IMPLANT
SUT STRATAFIX PDS+ 0 24IN (SUTURE) ×2 IMPLANT
SUT VIC AB 1 CT1 36 (SUTURE) ×2 IMPLANT
SUT VIC AB 2-0 CT1 27 (SUTURE) ×3
SUT VIC AB 2-0 CT1 TAPERPNT 27 (SUTURE) ×3 IMPLANT
SYR 3ML LL SCALE MARK (SYRINGE) ×2 IMPLANT
SYR 50ML LL SCALE MARK (SYRINGE) IMPLANT
TIB INSR RO PLAT 2.5X22.5 KNEE (Insert) ×2 IMPLANT
TOWER CARTRIDGE SMART MIX (DISPOSABLE) ×2 IMPLANT
TRAY FOLEY MTR SLVR 14FR STAT (SET/KITS/TRAYS/PACK) ×2 IMPLANT
TRAY FOLEY MTR SLVR 16FR STAT (SET/KITS/TRAYS/PACK) IMPLANT
TUBE KAMVAC SUCTION (TUBING) IMPLANT
TUBE SUCTION HIGH CAP CLEAR NV (SUCTIONS) ×4 IMPLANT
WATER STERILE IRR 1000ML POUR (IV SOLUTION) ×2 IMPLANT
WRAP KNEE MAXI GEL POST OP (GAUZE/BANDAGES/DRESSINGS) ×2 IMPLANT

## 2021-01-05 NOTE — Evaluation (Signed)
Physical Therapy Evaluation Patient Details Name: Megan Robles MRN: [id number] DOB: 1971-06-24 Today's Date: 01/05/2021   History of Present Illness  Patient is 50 y.o. female s/p Rt TKR on 01/05/21 with PMH significant for HTN, fibromyalgia, depression, anxiety, OA, Lt THA 2015, Rt TKA in 2015.    Clinical Impression  Megan Robles is a 50 y.o. female POD 0 s/p Rt TKA. Patient reports independence with mobility at baseline. Patient is now limited by functional impairments (see PT problem list below) and requires min assist for transfers and gait with RW. Patient was able to ambulate ~40 feet with RW and min assist. Patient instructed in exercise to facilitate circulation to manage edema and reduce risk of DVT. Patient will benefit from continued skilled PT interventions to address impairments and progress towards PLOF. Acute PT will follow to progress mobility and stair training in preparation for safe discharge home.     Follow Up Recommendations Follow surgeon's recommendation for DC plan and follow-up therapies;Home health PT    Equipment Recommendations  Rolling walker with 5" wheels    Recommendations for Other Services       Precautions / Restrictions Precautions Precautions: Fall Restrictions Weight Bearing Restrictions: No RLE Weight Bearing: Weight bearing as tolerated      Mobility  Bed Mobility Overal bed mobility: Needs Assistance Bed Mobility: Supine to Sit     Supine to sit: HOB elevated;Min guard     General bed mobility comments: pt able to mobilize to bil LE's without assist to EOB, pt using bed rails and taking extra time.    Transfers Overall transfer level: Needs assistance Equipment used: Rolling walker (2 wheeled) Transfers: Sit to/from Stand Sit to Stand: Min assist         General transfer comment: cues for hand placement with RW, min assist for power up and safe rise, pt using Lt LE and UE's as precaution due to weakness and pain in Rt  LE.  Ambulation/Gait Ambulation/Gait assistance: Min assist Gait Distance (Feet): 40 Feet Assistive device: Rolling walker (2 wheeled) Gait Pattern/deviations: Step-to pattern;Decreased stride length;Decreased weight shift to right Gait velocity: decr   General Gait Details: min cues for step to pattern and close proximity to RW, pt with good use of UE's to prevent buckling at Rt knee. occasional assist for walker position.  Stairs            Wheelchair Mobility    Modified Rankin (Stroke Patients Only)       Balance Overall balance assessment: Needs assistance Sitting-balance support: Feet supported;Bilateral upper extremity supported Sitting balance-Leahy Scale: Good     Standing balance support: During functional activity;Bilateral upper extremity supported Standing balance-Leahy Scale: Poor                               Pertinent Vitals/Pain Pain Assessment: 0-10 Pain Score: 3  Pain Location: Rt knee Pain Descriptors / Indicators: Aching;Discomfort;Burning Pain Intervention(s): Limited activity within patient's tolerance;Monitored during session;Premedicated before session;Repositioned;Ice applied    Home Living Family/patient expects to be discharged to:: Private residence Living Arrangements: Children Available Help at Discharge: Family Type of Home: Apartment Home Access: Stairs to enter Entrance Stairs-Rails: Lawyer of Steps: 6 Home Layout: One level Home Equipment: Environmental consultant - 4 wheels;Cane - single point;Bedside commode Additional Comments: pt's son can help some but does work during the day.    Prior Function Level of Independence: Independent  Hand Dominance   Dominant Hand: Right    Extremity/Trunk Assessment   Upper Extremity Assessment Upper Extremity Assessment: Overall WFL for tasks assessed    Lower Extremity Assessment Lower Extremity Assessment: RLE deficits/detail RLE:  Unable to fully assess due to pain RLE Sensation: WNL RLE Coordination: WNL    Cervical / Trunk Assessment Cervical / Trunk Assessment: Normal  Communication   Communication: No difficulties  Cognition Arousal/Alertness: Awake/alert Behavior During Therapy: WFL for tasks assessed/performed Overall Cognitive Status: Within Functional Limits for tasks assessed                                        General Comments      Exercises Total Joint Exercises Ankle Circles/Pumps: AROM;Both;20 reps;Seated   Assessment/Plan    PT Assessment Patient needs continued PT services  PT Problem List Decreased strength;Decreased range of motion;Decreased activity tolerance;Decreased mobility;Decreased balance;Decreased knowledge of use of DME;Decreased knowledge of precautions;Pain       PT Treatment Interventions DME instruction;Gait training;Stair training;Functional mobility training;Therapeutic activities;Therapeutic exercise;Balance training;Patient/family education    PT Goals (Current goals can be found in the Care Plan section)  Acute Rehab PT Goals Patient Stated Goal: get back home and regain independence PT Goal Formulation: With patient Time For Goal Achievement: 01/12/21 Potential to Achieve Goals: Good    Frequency 7X/week   Barriers to discharge        Co-evaluation               AM-PAC PT "6 Clicks" Mobility  Outcome Measure Help needed turning from your back to your side while in a flat bed without using bedrails?: A Little Help needed moving from lying on your back to sitting on the side of a flat bed without using bedrails?: A Little Help needed moving to and from a bed to a chair (including a wheelchair)?: A Little Help needed standing up from a chair using your arms (e.g., wheelchair or bedside chair)?: A Little Help needed to walk in hospital room?: A Little Help needed climbing 3-5 steps with a railing? : A Little 6 Click Score: 18     End of Session Equipment Utilized During Treatment: Gait belt Activity Tolerance: Patient tolerated treatment well Patient left: in chair;with call bell/phone within reach;with chair alarm set Nurse Communication: Mobility status PT Visit Diagnosis: Muscle weakness (generalized) (M62.81);Difficulty in walking, not elsewhere classified (R26.2)    Time: 2924-4628 PT Time Calculation (min) (ACUTE ONLY): 32 min   Charges:   PT Evaluation $PT Eval Low Complexity: 1 Low PT Treatments $Gait Training: 8-22 mins        Wynn Maudlin, DPT Acute Rehabilitation Services Office [phone] Pager [phone]    Anitra Lauth 01/05/2021, 7:06 PM

## 2021-01-05 NOTE — Interval H&P Note (Signed)
History and Physical Interval Note:  01/05/2021 7:41 AM  Megan Robles  has presented today for surgery, with the diagnosis of Failed Right total knee arthroplasty.  The various methods of treatment have been discussed with the patient and family. After consideration of risks, benefits and other options for treatment, the patient has consented to  Procedure(s) with comments: TOTAL KNEE REVISION (Right) - 90 min as a surgical intervention.  The patient's history has been reviewed, patient examined, no change in status, stable for surgery.  I have reviewed the patient's chart and labs.  Questions were answered to the patient's satisfaction.     [name]

## 2021-01-05 NOTE — Anesthesia Procedure Notes (Signed)
Date/Time: 01/05/2021 10:35 AM Performed by: Elyn Peers, CRNA Pre-anesthesia Checklist: Patient identified, Emergency Drugs available, Suction available, Patient being monitored and Timeout performed Oxygen Delivery Method: Simple face mask

## 2021-01-05 NOTE — Brief Op Note (Signed)
01/05/2021  10:44 AM  PATIENT:  Megan Robles  50 y.o. 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   Delay start of Pharmacological VTE agent (>24hrs) due to surgical blood loss or risk of bleeding: no

## 2021-01-05 NOTE — Anesthesia Preprocedure Evaluation (Signed)
Anesthesia Evaluation  Patient identified by MRN, date of birth, ID band Patient awake    Reviewed: Allergy & Precautions, NPO status , Patient's Chart, lab work & pertinent test results  Airway Mallampati: II  TM Distance: >3 FB Neck ROM: Full    Dental no notable dental hx. (+) Teeth Intact   Pulmonary    Pulmonary exam normal        Cardiovascular hypertension, Pt. on medications and Pt. on home beta blockers Normal cardiovascular exam Rhythm:Regular Rate:Normal     Neuro/Psych PSYCHIATRIC DISORDERS Anxiety Depression  Neuromuscular disease    GI/Hepatic negative GI ROS, Neg liver ROS,   Endo/Other  Morbid obesity  Renal/GU negative Renal ROS  negative genitourinary   Musculoskeletal  (+) Arthritis , Osteoarthritis,  Fibromyalgia -S/p TKR 2015 here for revision   Abdominal (+) + obese,   Peds  Hematology  (+) anemia ,   Anesthesia Other Findings   Reproductive/Obstetrics                             Anesthesia Physical  Anesthesia Plan  ASA: III  Anesthesia Plan: Spinal   Post-op Pain Management:  Regional for Post-op pain   Induction: Intravenous  PONV Risk Score and Plan: 2 and Ondansetron, Dexamethasone, Midazolam, Propofol infusion and Treatment may vary due to age or medical condition  Airway Management Planned: Simple Face Mask, Natural Airway and Nasal Cannula  Additional Equipment: None  Intra-op Plan:   Post-operative Plan:   Informed Consent: I have reviewed the patients History and Physical, chart, labs and discussed the procedure including the risks, benefits and alternatives for the proposed anesthesia with the patient or authorized representative who has indicated his/her understanding and acceptance.     Dental advisory given  Plan Discussed with: CRNA  Anesthesia Plan Comments: (   )        Anesthesia Quick Evaluation

## 2021-01-05 NOTE — Progress Notes (Signed)
Assisted Dr. Hatchett with right, ultrasound guided, adductor canal block. Side rails up, monitors on throughout procedure. See vital signs in flow sheet. Tolerated Procedure well.  

## 2021-01-05 NOTE — Discharge Instructions (Signed)

## 2021-01-05 NOTE — Anesthesia Procedure Notes (Signed)
Spinal  Patient location during procedure: OR Start time: 01/05/2021 10:36 AM End time: 01/05/2021 10:46 AM Reason for block: surgical anesthesia Staffing Performed: resident/CRNA  Resident/CRNA: Lollie Sails, CRNA Preanesthetic Checklist Completed: patient identified, IV checked, site marked, risks and benefits discussed, surgical consent, monitors and equipment checked, pre-op evaluation and timeout performed Spinal Block Patient position: sitting Prep: DuraPrep and site prepped and draped Patient monitoring: heart rate, continuous pulse ox, blood pressure and cardiac monitor Approach: midline Location: L3-4 Injection technique: single-shot Needle Needle type: Spinocan  Needle gauge: 24 G Needle length: 10 cm Assessment Events: CSF return Additional Notes Expiration date on kit noted and within range.  Sterile prep and drape.  Good CSF flow noted with no heme or c/o paresthesia.   Patient tolerated well.

## 2021-01-05 NOTE — Transfer of Care (Signed)
Immediate Anesthesia Transfer of Care Note  Patient: Megan Robles  Procedure(s) Performed: TOTAL KNEE REVISION (Right Knee)  Patient Location: PACU  Anesthesia Type:Spinal and MAC combined with regional for post-op pain  Level of Consciousness: awake, sedated and responds to stimulation  Airway & Oxygen Therapy: Patient Spontanous Breathing and Patient connected to face mask oxygen  Post-op Assessment: Report given to RN and Post -op Vital signs reviewed and stable  Post vital signs: Reviewed and stable  Last Vitals:  Vitals Value Taken Time  BP 112/65 01/05/21 1200  Temp    Pulse 63 01/05/21 1202  Resp 16 01/05/21 1202  SpO2 100 % 01/05/21 1202  Vitals shown include unvalidated device data.  Last Pain:  Vitals:   01/05/21 0733  TempSrc: Oral         Complications: No complications documented.

## 2021-01-05 NOTE — Anesthesia Postprocedure Evaluation (Signed)
Anesthesia Post Note  Patient: Megan Robles  Procedure(s) Performed: TOTAL KNEE REVISION (Right Knee)     Anesthesia Type: Spinal Level of consciousness: awake Pain management: pain level controlled Vital Signs Assessment: post-procedure vital signs reviewed and stable Respiratory status: spontaneous breathing Cardiovascular status: stable Postop Assessment: no headache, no backache, spinal receding, patient able to bend at knees and no apparent nausea or vomiting Anesthetic complications: no   No complications documented.  Last Vitals:  Vitals:   01/05/21 1345 01/05/21 1401  BP: 128/74 121/72  Pulse: (!) 55 61  Resp: 12 16  Temp: 36.5 C 36.6 C  SpO2: 100% 97%    Last Pain:  Vitals:   01/05/21 1444  TempSrc:   PainSc: 4                  John F [address]

## 2021-01-06 LAB — BASIC METABOLIC PANEL
Anion gap: 6 (ref 5–15)
BUN: 12 mg/dL (ref 6–20)
CO2: 23 mmol/L (ref 22–32)
Calcium: 8.6 mg/dL — ABNORMAL LOW (ref 8.9–10.3)
Chloride: 107 mmol/L (ref 98–111)
Creatinine, Ser: 0.54 mg/dL (ref 0.44–1.00)
GFR, Estimated: >60 mL/min (ref >60)
Glucose, Bld: 121 mg/dL — ABNORMAL HIGH (ref 70–99)
Potassium: 3.8 mmol/L (ref 3.5–5.1)
Sodium: 136 mmol/L (ref 135–145)

## 2021-01-06 LAB — CBC
HCT: 35.9 % — ABNORMAL LOW (ref 36.0–46.0)
Hemoglobin: 11.6 g/dL — ABNORMAL LOW (ref 12.0–15.0)
MCH: 29.7 pg (ref 26.0–34.0)
MCHC: 32.3 g/dL (ref 30.0–36.0)
MCV: 92.1 fL (ref 80.0–100.0)
Platelets: 284 10*3/uL (ref 150–400)
RBC: 3.9 MIL/uL (ref 3.87–5.11)
RDW: 13.1 % (ref 11.5–15.5)
WBC: 7.9 10*3/uL (ref 4.0–10.5)
nRBC: 0 % (ref 0.0–0.2)

## 2021-01-06 MED ORDER — METHOCARBAMOL 500 MG PO TABS: 500.0000 mg | ORAL_TABLET | Freq: Four times a day (QID) | ORAL | 0 refills | Status: DC | PRN

## 2021-01-06 MED ORDER — PROMETHAZINE HCL 12.5 MG PO TABS: 12.5000 mg | ORAL_TABLET | Freq: Four times a day (QID) | ORAL | 0 refills | Status: DC | PRN

## 2021-01-06 MED ORDER — ASPIRIN 81 MG PO CHEW: 81.0000 mg | CHEWABLE_TABLET | Freq: Two times a day (BID) | ORAL | 0 refills | Status: DC

## 2021-01-06 MED ORDER — CELECOXIB 200 MG PO CAPS: 200.0000 mg | ORAL_CAPSULE | Freq: Two times a day (BID) | ORAL | 0 refills | Status: DC

## 2021-01-06 MED ORDER — OXYCODONE HCL 5 MG PO TABS: 5.0000 mg | ORAL_TABLET | Freq: Four times a day (QID) | ORAL | 0 refills | Status: DC | PRN

## 2021-01-06 MED ORDER — POLYETHYLENE GLYCOL 3350 17 G PO PACK
17.0000 g | PACK | Freq: Every day | ORAL | 0 refills | Status: DC | PRN
Start: 2021-01-06 — End: 2021-01-07

## 2021-01-06 MED ORDER — PROMETHAZINE HCL 25 MG PO TABS
12.5000 mg | ORAL_TABLET | Freq: Four times a day (QID) | ORAL | Status: DC | PRN
  Administered 2021-01-06 – 2021-01-07 (×2): 12.5 mg via ORAL
  Filled 2021-01-06 (×2): qty 1

## 2021-01-06 MED ORDER — DOCUSATE SODIUM 100 MG PO CAPS: 100.0000 mg | ORAL_CAPSULE | Freq: Two times a day (BID) | ORAL | 0 refills | Status: DC

## 2021-01-06 NOTE — Progress Notes (Signed)
Physical Therapy Treatment Patient Details Name: Megan Robles MRN: [id number] DOB: [birth year] Today's Date: 01/06/2021    History of Present Illness Patient is 50 y.o. female s/p Rt TKR on 01/05/21 with PMH significant for HTN, fibromyalgia, depression, anxiety, OA, Lt THA 2015, Rt TKA in 2015.    PT Comments    Pt very cooperative but limited this am by ongoing nausea.  Pt performed limited therex program and up to ambulate limited distance in halls.   Follow Up Recommendations  Follow surgeon's recommendation for DC plan and follow-up therapies;Home health PT     Equipment Recommendations  Rolling walker with 5" wheels    Recommendations for Other Services       Precautions / Restrictions Precautions Precautions: Fall Restrictions Weight Bearing Restrictions: No RLE Weight Bearing: Weight bearing as tolerated    Mobility  Bed Mobility Overal bed mobility: Needs Assistance Bed Mobility: Supine to Sit;Sit to Supine     Supine to sit: Supervision Sit to supine: Min guard   General bed mobility comments: pt able to mobilize to bil LE's without assist to EOB, pt using bed rails and taking extra time 2* nausea    Transfers Overall transfer level: Needs assistance Equipment used: Rolling walker (2 wheeled) Transfers: Sit to/from Stand Sit to Stand: Min guard         General transfer comment: cues for LE management and use of UEs to self assist  Ambulation/Gait Ambulation/Gait assistance: Min guard Gait Distance (Feet): 60 Feet Assistive device: Rolling walker (2 wheeled) Gait Pattern/deviations: Step-to pattern;Decreased stride length;Decreased weight shift to right;Shuffle Gait velocity: decr   General Gait Details: cues for sequence, posture, stride length and position from RW; Multiple standing rest breaks 2* nausea   Stairs             Wheelchair Mobility    Modified Rankin (Stroke Patients Only)       Balance Overall balance assessment:  Needs assistance Sitting-balance support: Feet supported;Bilateral upper extremity supported Sitting balance-Leahy Scale: Good     Standing balance support: During functional activity;Bilateral upper extremity supported Standing balance-Leahy Scale: Poor                              Cognition Arousal/Alertness: Awake/alert Behavior During Therapy: WFL for tasks assessed/performed Overall Cognitive Status: Within Functional Limits for tasks assessed                                        Exercises Total Joint Exercises Ankle Circles/Pumps: AROM;Both;20 reps;Seated Quad Sets: AROM;Both;10 reps;Supine Heel Slides: AAROM;Right;15 reps;Supine Straight Leg Raises: AAROM;Right;10 reps;Supine    General Comments        Pertinent Vitals/Pain Pain Assessment: 0-10 Pain Score: 4  Pain Location: Rt knee Pain Descriptors / Indicators: Aching;Discomfort;Burning Pain Intervention(s): Limited activity within patient's tolerance;Monitored during session;Premedicated before session;Ice applied    Home Living                      Prior Function            PT Goals (current goals can now be found in the care plan section) Acute Rehab PT Goals Patient Stated Goal: get back home and regain independence PT Goal Formulation: With patient Time For Goal Achievement: 01/12/21 Potential to Achieve Goals: Good Progress towards PT goals: Progressing toward goals  Frequency    7X/week      PT Plan Current plan remains appropriate    Co-evaluation              AM-PAC PT "6 Clicks" Mobility   Outcome Measure  Help needed turning from your back to your side while in a flat bed without using bedrails?: A Little Help needed moving from lying on your back to sitting on the side of a flat bed without using bedrails?: A Little Help needed moving to and from a bed to a chair (including a wheelchair)?: A Little Help needed standing up from a chair  using your arms (e.g., wheelchair or bedside chair)?: A Little Help needed to walk in hospital room?: A Little Help needed climbing 3-5 steps with a railing? : A Little 6 Click Score: 18    End of Session Equipment Utilized During Treatment: Gait belt Activity Tolerance: Patient tolerated treatment well Patient left: in bed;with call bell/phone within reach Nurse Communication: Mobility status PT Visit Diagnosis: Muscle weakness (generalized) (M62.81);Difficulty in walking, not elsewhere classified (R26.2)     Time: 6378-5885 PT Time Calculation (min) (ACUTE ONLY): 36 min  Charges:  $Gait Training: 8-22 mins $Therapeutic Exercise: 8-22 mins                     Mauro Kaufmann PT Acute Rehabilitation Services Pager [phone] Office [phone]    [facility] 01/06/2021, 12:34 PM

## 2021-01-06 NOTE — Plan of Care (Signed)
  Problem: Clinical Measurements: Goal: Postoperative complications will be avoided or minimized Outcome: Progressing   Problem: Pain Management: Goal: Pain level will decrease with appropriate interventions Outcome: Progressing   Problem: Clinical Measurements: Goal: Will remain free from infection Outcome: Progressing   Problem: Nutrition: Goal: Adequate nutrition will be maintained Outcome: Progressing   Problem: Coping: Goal: Level of anxiety will decrease Outcome: Progressing

## 2021-01-06 NOTE — Progress Notes (Signed)
Physical Therapy Treatment Patient Details Name: Megan Robles MRN: [id number] DOB: 01/14/1971 Today's Date: 01/06/2021    History of Present Illness Patient is 50 y.o. female s/p Rt TKR on 01/05/21 with PMH significant for HTN, fibromyalgia, depression, anxiety, OA, Lt THA 2015, Rt TKA in 2015.    PT Comments    Pt very cooperative and with noted improvement in activity tolerance 2* better controlled nausea.  Pt up to ambulate increased distance in hall and negotiate stairs with min assist.   Follow Up Recommendations  Follow surgeon's recommendation for DC plan and follow-up therapies;Home health PT     Equipment Recommendations  Rolling walker with 5" wheels    Recommendations for Other Services       Precautions / Restrictions Precautions Precautions: Fall Restrictions Weight Bearing Restrictions: No RLE Weight Bearing: Weight bearing as tolerated    Mobility  Bed Mobility Overal bed mobility: Needs Assistance Bed Mobility: Supine to Sit;Sit to Supine     Supine to sit: Supervision Sit to supine: Supervision   General bed mobility comments: pt utilizing gait belt to self assist    Transfers Overall transfer level: Needs assistance Equipment used: Rolling walker (2 wheeled) Transfers: Sit to/from Stand Sit to Stand: Min guard;Supervision         General transfer comment: cues for LE management and use of UEs to self assist  Ambulation/Gait Ambulation/Gait assistance: Min guard;Supervision Gait Distance (Feet): 120 Feet Assistive device: Rolling walker (2 wheeled);4-wheeled walker Gait Pattern/deviations: Step-to pattern;Decreased stride length;Decreased weight shift to right;Shuffle Gait velocity: decr   General Gait Details: cues for sequence, posture, stride length and position from RW/rollator   Stairs Stairs: Yes Stairs assistance: Min assist Stair Management: One rail Right;Step to pattern;Forwards;With crutches Number of Stairs: 10 General  stair comments: 2+3+2+3 stairs with rail/crutch and cues for sequence and foot/crutch placement   Wheelchair Mobility    Modified Rankin (Stroke Patients Only)       Balance Overall balance assessment: Needs assistance Sitting-balance support: Feet supported;Bilateral upper extremity supported Sitting balance-Leahy Scale: Good     Standing balance support: No upper extremity supported Standing balance-Leahy Scale: Fair                              Cognition Arousal/Alertness: Awake/alert Behavior During Therapy: WFL for tasks assessed/performed Overall Cognitive Status: Within Functional Limits for tasks assessed                                        Exercises      General Comments        Pertinent Vitals/Pain Pain Assessment: 0-10 Pain Score: 4  Pain Location: Rt knee Pain Descriptors / Indicators: Aching;Discomfort;Burning Pain Intervention(s): Limited activity within patient's tolerance;Monitored during session;Premedicated before session;Ice applied    Home Living                      Prior Function            PT Goals (current goals can now be found in the care plan section) Acute Rehab PT Goals Patient Stated Goal: get back home and regain independence PT Goal Formulation: With patient Time For Goal Achievement: 01/12/21 Potential to Achieve Goals: Good Progress towards PT goals: Progressing toward goals    Frequency    7X/week  PT Plan Current plan remains appropriate    Co-evaluation              AM-PAC PT "6 Clicks" Mobility   Outcome Measure  Help needed turning from your back to your side while in a flat bed without using bedrails?: A Little Help needed moving from lying on your back to sitting on the side of a flat bed without using bedrails?: A Little Help needed moving to and from a bed to a chair (including a wheelchair)?: A Little Help needed standing up from a chair using your  arms (e.g., wheelchair or bedside chair)?: A Little Help needed to walk in hospital room?: A Little Help needed climbing 3-5 steps with a railing? : A Little 6 Click Score: 18    End of Session Equipment Utilized During Treatment: Gait belt Activity Tolerance: Patient tolerated treatment well Patient left: in bed;with call bell/phone within reach Nurse Communication: Mobility status PT Visit Diagnosis: Muscle weakness (generalized) (M62.81);Difficulty in walking, not elsewhere classified (R26.2)     Time: 4098-1191 PT Time Calculation (min) (ACUTE ONLY): 29 min  Charges:  $Gait Training: 23-37 mins                     Mauro Kaufmann PT Acute Rehabilitation Services Pager [phone] Office [phone]    Karmina Zufall 01/06/2021, 5:38 PM

## 2021-01-06 NOTE — TOC Transition Note (Signed)
Transition of Care [facility]) - CM/SW Discharge Note   Patient Details  Name: Megan Robles MRN: [id number] Date of Birth: 08/25/1971  Transition of Care [facility]) CM/SW Contact:  Lennart Pall, LCSW Phone Number: 01/06/2021, 2:04 PM   Clinical Narrative:    Met with pt and confirming she has a rollator at home and asks about getting a rolling walker.  Explained that insurance will not cover another walker yet and she plans to purchase one on her own.   Pt aware that MD has recommeded HEP until she is seen in the follow up clinic (pt had asked about possible [facility] [employer] services.).  No further TOC needs.   Final next level of care: OP Rehab Barriers to Discharge: No Barriers Identified   Patient Goals and CMS Choice Patient states their goals for this hospitalization and ongoing recovery are:: return home      Discharge Placement                       Discharge Plan and Services                DME Arranged: N/A DME Agency: NA                  Social Determinants of Health (SDOH) Interventions     Readmission Risk Interventions No flowsheet data found.

## 2021-01-06 NOTE — Op Note (Signed)
Megan Robles, Megan Robles MEDICAL RECORD NO: [id number] ACCOUNT NO: [id number] DATE OF BIRTH: [birth year] FACILITY: [facility] LOCATION: [id number] PHYSICIAN: Madlyn Frankel. 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  PROCEDURE:  Revision right total knee arthroplasty, single component, polyethylene insert upsized from a size 15 to a size 22.5 to match a 2.5 femur.  SURGEON:  Madlyn Frankel. Charlann Boxer, MD.  ASSISTANT:  Dennie Bible, PA-C.  Note that Ms. Adrian Blackwater was present for the entirety of the case for preoperative positioning, perioperative management of the operative extremity, general facilitation of the case and primary wound closure.  ANESTHESIA:  Regional plus spinal.  SPECIMENS:  None.  COMPLICATIONS:  None.  TOURNIQUET:  Up for 10 minutes at 250 mmHg.  INDICATIONS:  The patient is a pleasant 50 year old female with a history of right total knee arthroplasty.  Over the past several years, we have been following her knee postoperatively.  She has stable components that are well fixed; however, developed  findings on exam of global instability with hyperextension as well as flexion instability.  We discussed the etiology of her ligaments and capsule stretching that resulted in this.  We discussed surgical intervention in the form of revision total knee  arthroplasty as opposed to polyethylene revision.  Given the global instability, I felt the polyethylene revision would be the best place to start.  We reviewed the risk of infection, DVT, component failure, need for future surgery.  I did stress with  her the potential for her ligaments and capsule stretch out further requiring need for revision surgery to perhaps a more constrained option.  Consent was obtained for the  benefit of pain relief.  DESCRIPTION OF PROCEDURE:  The patient was brought to the operative theater.  Once adequate anesthesia, preoperative antibiotics, Ancef administered as well as tranexamic acid.  She was positioned supine with a right thigh tourniquet placed.  The right  lower extremity was then prepped and draped in sterile fashion.  A time-out was performed identifying the patient, planned procedure, and extremity.  The leg was exsanguinated and tourniquet elevated to 250 mmHg.  Her old incision was identified and  excised.  Soft tissue planes were created.  A median arthrotomy was made, encountering clear synovial fluid.  Following initial soft tissue exposure including debridement of scar tissue around the patella., I removed the old polyethylene.  We then had  trials available and I felt that even going up to a 22.5 that she still had a little bit of hyperextension, but this definitely helped in flexion.  I thus selected the 22.5 insert to match a size 2.5 femur.  The final insert was then placed into the  knee.  We injected the synovial capsule junction of the knee with 0.25% Marcaine with epinephrine, 1 mL of Toradol and saline.  The tourniquet was let down after 10 minutes without significant hemostasis required.  We irrigated the knee at this point  with about 500 mL of normal saline solution.  The extensor mechanism was then reapproximated with the knee in flexion using #1 Vicryl and #1 Stratafix suture.  The remainder of the wound was closed in layers with 2-0 Vicryl and a running Monocryl stitch.   The knee was then cleaned, dried and dressed sterilely using surgical glue and Aquacel dressing.  She was then  brought to the recovery room in stable condition, tolerated the procedure well.  I will plan to have her spend the night for IV antibiotics with an assessment with therapy.  We will get her home in the morning and follow up in the office in 2 weeks to initiate physical therapy and  other needs.   [email or web address] D: 01/05/2021 11:37:05 am T: 01/06/2021 6:16:00 am  JOB: [id number]/ [id number]

## 2021-01-06 NOTE — Progress Notes (Signed)
Patient ID: Megan Robles, female   DOB: 1970/11/29, 50 y.o.   MRN: [id number] Subjective: 1 Day Post-Op Procedure(s) (LRB): TOTAL KNEE REVISION (Right)    Patient reports pain as moderate.  Was able to get settle down last night with oxycodone and robaxin.  Last meds were at 3:30am As we were talking she became nauseated and threw up  Objective:   VITALS:   Vitals:   01/06/21 0155 01/06/21 0536  BP: (!) 157/83 (!) 144/77  Pulse: (!) 53 61  Resp: 18 16  Temp: 97.8 F (36.6 C) 97.7 F (36.5 C)  SpO2: 99% 100%    Neurovascular intact Incision: dressing C/D/I - right knee  LABS Recent Labs    01/05/21 0731 01/06/21 0252  HGB 12.3 11.6*  HCT 38.2 35.9*  WBC 5.0 7.9  PLT 259 284    Recent Labs    01/05/21 0731 01/06/21 0252  NA 140 136  K 3.5 3.8  BUN 13 12  CREATININE 0.65 0.54  GLUCOSE 97 121*    Recent Labs    01/05/21 0731  INR 1.0     Assessment/Plan: 1 Day Post-Op Procedure(s) (LRB): TOTAL KNEE REVISION (Right)   Advance diet Up with therapy - WBAT  If we are able to control pain and nausea and she clears therapy she is very interested in going home today.  If pain control and PONV still an issue then she will stay and go home tomorrow.  We will likely get PT set up at our first post op visit  Phenergan at discharge for nausea Oxycodone at discharge to add to her current use for the first couple weeks  RTC in 2 weeks

## 2021-01-07 LAB — CBC
HCT: 34.9 % — ABNORMAL LOW (ref 36.0–46.0)
Hemoglobin: 11 g/dL — ABNORMAL LOW (ref 12.0–15.0)
MCH: 29.1 pg (ref 26.0–34.0)
MCHC: 31.5 g/dL (ref 30.0–36.0)
MCV: 92.3 fL (ref 80.0–100.0)
Platelets: 252 10*3/uL (ref 150–400)
RBC: 3.78 MIL/uL — ABNORMAL LOW (ref 3.87–5.11)
RDW: 13.3 % (ref 11.5–15.5)
WBC: 9 10*3/uL (ref 4.0–10.5)
nRBC: 0 % (ref 0.0–0.2)

## 2021-01-07 MED ORDER — ASPIRIN 81 MG PO CHEW: 81.0000 mg | CHEWABLE_TABLET | Freq: Two times a day (BID) | ORAL | 0 refills | Status: AC

## 2021-01-07 MED ORDER — CELECOXIB 200 MG PO CAPS: 200.0000 mg | ORAL_CAPSULE | Freq: Two times a day (BID) | ORAL | 0 refills | Status: DC

## 2021-01-07 MED ORDER — PROMETHAZINE HCL 12.5 MG PO TABS: 12.5000 mg | ORAL_TABLET | Freq: Four times a day (QID) | ORAL | 0 refills | Status: AC | PRN

## 2021-01-07 MED ORDER — DOCUSATE SODIUM 100 MG PO CAPS: 100.0000 mg | ORAL_CAPSULE | Freq: Two times a day (BID) | ORAL | 0 refills | Status: AC

## 2021-01-07 MED ORDER — METHOCARBAMOL 500 MG PO TABS: 500.0000 mg | ORAL_TABLET | Freq: Four times a day (QID) | ORAL | 0 refills | Status: AC | PRN

## 2021-01-07 MED ORDER — POLYETHYLENE GLYCOL 3350 17 G PO PACK: 17.0000 g | PACK | Freq: Every day | ORAL | 0 refills | Status: AC | PRN

## 2021-01-07 MED ORDER — OXYCODONE HCL 5 MG PO TABS: 5.0000 mg | ORAL_TABLET | Freq: Four times a day (QID) | ORAL | 0 refills | Status: DC | PRN

## 2021-01-07 NOTE — Plan of Care (Signed)
  Problem: Education: Goal: Knowledge of the prescribed therapeutic regimen will improve Outcome: Progressing   Problem: Activity: Goal: Ability to avoid complications of mobility impairment will improve Outcome: Progressing   Problem: Pain Management: Goal: Pain level will decrease with appropriate interventions Outcome: Progressing   

## 2021-01-07 NOTE — Progress Notes (Signed)
Patient ID: Megan Robles, female   DOB: Feb 13, 1971, 50 y.o.   MRN: [id number] Subjective: 2 Days Post-Op Procedure(s) (LRB): TOTAL KNEE REVISION (Right)    Patient reports pain as mild to moderate.  No complaints of chest pain, nausea, vomiting, shortness of breath Objective:   VITALS:   Vitals:   01/06/21 2033 01/07/21 0628  BP: 123/76 136/69  Pulse: (!) 54 62  Resp: 16 18  Temp: 98.2 F (36.8 C) 98.9 F (37.2 C)  SpO2: 98% 100%    Neurovascular intact Incision: dressing C/D/I - right knee  LABS Recent Labs    01/05/21 0731 01/06/21 0252 01/07/21 0259  HGB 12.3 11.6* 11.0*  HCT 38.2 35.9* 34.9*  WBC 5.0 7.9 9.0  PLT 259 284 252    Recent Labs    01/05/21 0731 01/06/21 0252  NA 140 136  K 3.5 3.8  BUN 13 12  CREATININE 0.65 0.54  GLUCOSE 97 121*    Recent Labs    01/05/21 0731  INR 1.0     Assessment/Plan: 2 Days Post-Op Procedure(s) (LRB): TOTAL KNEE REVISION (Right)   Advance diet Up with therapy - WBAT  Plan to DC today  We will likely get PT set up at our first post op visit  Phenergan at discharge for nausea Oxycodone at discharge to add to her current use for the first couple weeks  RTC in 2 weeks

## 2021-01-07 NOTE — Progress Notes (Signed)
Physical Therapy Treatment Patient Details Name: Megan Robles MRN: [id number] DOB: 08/24/1971 Today's Date: 01/07/2021    History of Present Illness Patient is 50 y.o. female s/p Rt TKR on 01/05/21 with PMH significant for HTN, fibromyalgia, depression, anxiety, OA, Lt THA 2015, Rt TKA in 2015.    PT Comments    Pt continues motivated and progressing with mobility but struggling with therex this am 2* increased pain.  Pt performed limited therex program and reviewed written HEP.  Pt up to ambulate in hall and negotiated stairs with min difficulty.  Pt eager for return home but requesting again for HHPT follow up.   Follow Up Recommendations  Follow surgeon's recommendation for DC plan and follow-up therapies;Home health PT     Equipment Recommendations  None recommended by PT    Recommendations for Other Services       Precautions / Restrictions Precautions Precautions: Fall Restrictions Weight Bearing Restrictions: No RLE Weight Bearing: Weight bearing as tolerated    Mobility  Bed Mobility Overal bed mobility: Needs Assistance Bed Mobility: Supine to Sit;Sit to Supine     Supine to sit: Supervision Sit to supine: Supervision   General bed mobility comments: pt utilizing gait belt to self assist    Transfers Overall transfer level: Needs assistance Equipment used: Rolling walker (2 wheeled) Transfers: Sit to/from Stand Sit to Stand: Supervision         General transfer comment: cues for LE management and use of UEs to self assist  Ambulation/Gait Ambulation/Gait assistance: Min guard;Supervision Gait Distance (Feet): 120 Feet Assistive device: Rolling walker (2 wheeled);4-wheeled walker Gait Pattern/deviations: Step-to pattern;Decreased stride length;Decreased weight shift to right;Shuffle Gait velocity: decr   General Gait Details: cues for sequence, posture, stride length and position from RW   Stairs Stairs: Yes Stairs assistance: Min  assist Stair Management: One rail Right;Step to pattern;Forwards;With crutches Number of Stairs: 5 General stair comments: 3+3 stairs with rail/crutch and min cues for sequence and foot/crutch placement   Wheelchair Mobility    Modified Rankin (Stroke Patients Only)       Balance Overall balance assessment: Needs assistance Sitting-balance support: Feet supported;Bilateral upper extremity supported Sitting balance-Leahy Scale: Good     Standing balance support: No upper extremity supported Standing balance-Leahy Scale: Fair                              Cognition Arousal/Alertness: Awake/alert Behavior During Therapy: WFL for tasks assessed/performed Overall Cognitive Status: Within Functional Limits for tasks assessed                                        Exercises Total Joint Exercises Ankle Circles/Pumps: AROM;Both;20 reps;Seated Quad Sets: AROM;Both;10 reps;Supine Heel Slides: AAROM;Right;Supine;10 reps Straight Leg Raises: AAROM;Right;Supine;5 reps Long Arc Quad: AAROM;Right;5 reps;Seated Goniometric ROM: AAROM R knee -5 - 40 - pain limited    General Comments        Pertinent Vitals/Pain Pain Assessment: 0-10 Pain Score: 6  Pain Location: Rt knee Pain Descriptors / Indicators: Aching;Discomfort;Burning Pain Intervention(s): Limited activity within patient's tolerance;Monitored during session;Premedicated before session;Patient requesting pain meds-RN notified;Ice applied    Home Living                      Prior Function            PT  Goals (current goals can now be found in the care plan section) Acute Rehab PT Goals Patient Stated Goal: get back home and regain independence PT Goal Formulation: With patient Time For Goal Achievement: 01/12/21 Potential to Achieve Goals: Good Progress towards PT goals: Progressing toward goals    Frequency    7X/week      PT Plan Current plan remains appropriate     Co-evaluation              AM-PAC PT "6 Clicks" Mobility   Outcome Measure  Help needed turning from your back to your side while in a flat bed without using bedrails?: A Little Help needed moving from lying on your back to sitting on the side of a flat bed without using bedrails?: A Little Help needed moving to and from a bed to a chair (including a wheelchair)?: A Little Help needed standing up from a chair using your arms (e.g., wheelchair or bedside chair)?: A Little Help needed to walk in hospital room?: A Little Help needed climbing 3-5 steps with a railing? : A Little 6 Click Score: 18    End of Session Equipment Utilized During Treatment: Gait belt Activity Tolerance: Patient tolerated treatment well;Patient limited by pain Patient left: in bed;with call bell/phone within reach Nurse Communication: Mobility status PT Visit Diagnosis: Muscle weakness (generalized) (M62.81);Difficulty in walking, not elsewhere classified (R26.2)     Time: 1102-1140 PT Time Calculation (min) (ACUTE ONLY): 38 min  Charges:  $Gait Training: 8-22 mins $Therapeutic Exercise: 8-22 mins                     Mauro Kaufmann PT Acute Rehabilitation Services Pager [phone] Office [phone]    Feige Lowdermilk 01/07/2021, 12:36 PM

## 2021-01-07 NOTE — Plan of Care (Signed)
Patient discharging, all care plans competed.

## 2021-01-08 ENCOUNTER — Encounter (HOSPITAL_COMMUNITY): Payer: Self-pay | Admitting: Orthopedic Surgery

## 2021-01-09 ENCOUNTER — Other Ambulatory Visit (HOSPITAL_COMMUNITY): Payer: Self-pay

## 2021-01-16 NOTE — Discharge Summary (Signed)
Physician Discharge Summary   Patient ID: Megan Robles MRN: [id number] DOB/AGE: Sep 17, 1971 50 y.o.  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  Admission Diagnoses:  Past Medical History:  Diagnosis Date  . Anemia 2004  . Anxiety   . Arthritis   . Depression   . Fibromyalgia   . Heart murmur   . History of blood transfusion 2004/2015  . Hypertension   . Pneumonia 2004  . URI (upper respiratory infection) 03/19/14   productive cough with yellow nasal discharge with fever- did not seek treatment- 7/17/15states resolved except occ cough now   Discharge Diagnoses:   Active Problems:   S/P revision of total knee, right  Estimated body mass index is 40.41 kg/m as calculated from the following:   Height as of this encounter: 5\' 4"  (1.626 m).   Weight as of this encounter: 106.8 kg.  Procedure:  Procedure(s) (LRB): TOTAL KNEE REVISION (Right)   Consults: None  HPI: The patient is a pleasant 50 year old female with a history of right total knee arthroplasty.  Over the past several years, we have been following her knee postoperatively.  She has stable components that are well fixed; however, developed  findings on exam of global instability with hyperextension as well as flexion instability.  We discussed the etiology of her ligaments and capsule stretching that resulted in this.  We discussed surgical intervention in the form of revision total knee  arthroplasty as opposed to polyethylene revision.  Given the global instability, I felt the polyethylene revision would be the best place to start.  We reviewed the risk of infection, DVT, component failure, need for future surgery.  I did stress with  her the potential for her ligaments and capsule stretch out further requiring need for revision surgery to perhaps a more constrained option.  Laboratory Data: Admission on  01/05/2021, Discharged on 01/07/2021  Component Date Value Ref Range Status  . WBC 01/05/2021 5.0  4.0 - 10.5 K/uL Final  . RBC 01/05/2021 4.15  3.87 - 5.11 MIL/uL Final  . Hemoglobin 01/05/2021 12.3  12.0 - 15.0 g/dL Final  . HCT 01/07/2021 38.2  36.0 - 46.0 % Final  . MCV 01/05/2021 92.0  80.0 - 100.0 fL Final  . MCH 01/05/2021 29.6  26.0 - 34.0 pg Final  . MCHC 01/05/2021 32.2  30.0 - 36.0 g/dL Final  . RDW 01/07/2021 13.5  11.5 - 15.5 % Final  . Platelets 01/05/2021 259  150 - 400 K/uL Final  . nRBC 01/05/2021 0.0  0.0 - 0.2 % Final   Performed at [facility], [address] [place], [place]  . Sodium 01/05/2021 140  135 - 145 mmol/L Final  . Potassium 01/05/2021 3.5  3.5 - 5.1 mmol/L Final  . Chloride 01/05/2021 108  98 - 111 mmol/L Final  . CO2 01/05/2021 29  22 - 32 mmol/L Final  . Glucose, Bld 01/05/2021 97  70 - 99 mg/dL Final   Glucose reference range applies only to samples taken after fasting for at least 8 hours.  . BUN 01/05/2021 13  6 - 20 mg/dL Final  . Creatinine, Ser 01/05/2021 0.65  0.44 - 1.00 mg/dL Final  . Calcium 01/07/2021 8.8* 8.9 - 10.3 mg/dL Final  . Total Protein 01/05/2021 6.3* 6.5 - 8.1 g/dL Final  . Albumin 01/07/2021 3.4* 3.5 - 5.0 g/dL Final  . AST 73/71/0626 16  15 - 41 U/L Final  . ALT 01/05/2021 15  0 - 44 U/L Final  . Alkaline Phosphatase 01/05/2021 55  38 - 126 U/L Final  . Total Bilirubin 01/05/2021 0.5  0.3 - 1.2 mg/dL Final  . GFR, Estimated 01/05/2021 >60  >60 mL/min Final   Comment: (NOTE) Calculated using the CKD-EPI Creatinine Equation (2021)   . Anion gap 01/05/2021 3* 5 - 15 Final   Performed at [facility], [address] [place], [place] [address]  . Prothrombin Time 01/05/2021 12.9  11.4 - 15.2 seconds Final  . INR 01/05/2021 1.0  0.8 - 1.2 Final   Comment: (NOTE) INR goal varies based on device and disease states. Performed at [facility], [address] [place], [place] [address]   . aPTT 01/05/2021 32  24 - 36 seconds Final   Performed at [facility], [address] [place], [place] [address]  . ABO/RH(D) 01/05/2021 O POS   Final  . Antibody Screen 01/05/2021 NEG   Final  . Sample Expiration 01/05/2021    Final                   Value:01/08/2021,2359 Performed at [facility], [address] [place], [place] [address]   . WBC 01/06/2021 7.9  4.0 - 10.5 K/uL Final  . RBC 01/06/2021 3.90  3.87 - 5.11 MIL/uL Final  . Hemoglobin 01/06/2021 11.6* 12.0 - 15.0 g/dL Final  . HCT 29/56/213004/15/2022 35.9* 36.0 - 46.0 % Final  . MCV 01/06/2021 92.1  80.0 - 100.0 fL Final  . MCH 01/06/2021 29.7  26.0 - 34.0 pg Final  . MCHC 01/06/2021 32.3  30.0 - 36.0 g/dL Final  . RDW 86/57/846904/15/2022 13.1  11.5 - 15.5 % Final  . Platelets 01/06/2021 284  150 - 400 K/uL Final  . nRBC 01/06/2021 0.0  0.0 - 0.2 % Final   Performed at [facility], [address] [place], [place] [address]  . Sodium 01/06/2021 136  135 - 145 mmol/L Final  . Potassium 01/06/2021 3.8  3.5 - 5.1 mmol/L Final  . Chloride 01/06/2021 107  98 - 111 mmol/L Final  . CO2 01/06/2021 23  22 - 32 mmol/L Final  . Glucose, Bld 01/06/2021 121* 70 - 99 mg/dL Final   Glucose reference range applies only to samples taken after fasting for at least 8 hours.  . BUN 01/06/2021 12  6 - 20 mg/dL Final  . Creatinine, Ser 01/06/2021 0.54  0.44 - 1.00 mg/dL Final  . Calcium 84/13/244004/15/2022 8.6* 8.9 - 10.3 mg/dL Final  . GFR, Estimated 01/06/2021 >60  >60 mL/min Final   Comment: (NOTE) Calculated using the CKD-EPI Creatinine Equation (2021)   . Anion gap 01/06/2021 6  5 - 15 Final   Performed at [facility], [address] [place], [place] [address]  . WBC 01/07/2021 9.0  4.0 - 10.5 K/uL Final  . RBC 01/07/2021 3.78* 3.87 - 5.11 MIL/uL Final  . Hemoglobin 01/07/2021 11.0* 12.0 - 15.0 g/dL Final  . HCT 53/66/440304/16/2022 34.9* 36.0 - 46.0 %  Final  . MCV 01/07/2021 92.3  80.0 - 100.0 fL Final  . MCH 01/07/2021 29.1  26.0 - 34.0 pg Final  . MCHC 01/07/2021 31.5  30.0 - 36.0 g/dL Final  . RDW 47/42/595604/16/2022 13.3  11.5 - 15.5 % Final  . Platelets 01/07/2021 252  150 - 400 K/uL Final  . nRBC 01/07/2021  0.0  0.0 - 0.2 % Final   Performed at [facility], [address], [place], [place] [address]  Admission on 12/08/2020, Discharged on 12/08/2020  Component Date Value Ref Range Status  . SARS Coronavirus 2 12/08/2020 POSITIVE* NEGATIVE Final   Comment: RESULT CALLED TO, READ BACK BY AND VERIFIED WITH: SHEPHERD,K. RN AT 1014 12/08/20 MULLINS,T (NOTE) SARS-CoV-2 target nucleic acids are DETECTED  SARS-CoV-2 RNA is generally detectable in upper respiratory specimens  during the acute phase of infection.  Positive results are indicative  of the presence of the identified virus, but do not rule out bacterial infection or co-infection with other pathogens not detected by the test.  Clinical correlation with patient history and  other diagnostic information is necessary to determine patient infection status.  The expected result is negative.  Fact Sheet for Patients:   BoilerBrush.com.cy   Fact Sheet for Healthcare Providers:   https://pope.com/    This test is not yet approved or cleared by the [place] FDA and  has been authorized for detection and/or diagnosis of SARS-CoV-2 by FDA under an Emergency Use Authorization (EUA).  This EUA will remain in effect (meaning                           this test can be used) for the duration of  the COVID-19 declaration under Section 564(b)(1) of the Act, 21 U.S.C. section 360-bbb-3(b)(1), unless the authorization is terminated or revoked sooner.  Performed at [facility], [address] [place], [place] [address]   Hospital Outpatient Visit on 12/02/2020  Component Date Value Ref Range Status   . MRSA, PCR 12/02/2020 NEGATIVE  NEGATIVE Final  . Staphylococcus aureus 12/02/2020 NEGATIVE  NEGATIVE Final   Comment: (NOTE) The Xpert SA Assay (FDA approved for NASAL specimens in patients 21 years of age and older), is one component of a comprehensive surveillance program. It is not intended to diagnose infection nor to guide or monitor treatment. Performed at [facility], [address] [place], [place] [address]   . ABO/RH(D) 12/02/2020 O POS   Final  . Antibody Screen 12/02/2020 NEG   Final  . Sample Expiration 12/02/2020 12/11/2020,2359   Final  . Extend sample reason 12/02/2020    Final                   Value:NO TRANSFUSIONS OR PREGNANCY IN THE PAST 3 MONTHS Performed at [facility], [address] [place], [place] [address]   . WBC 12/02/2020 6.3  4.0 - 10.5 K/uL Final  . RBC 12/02/2020 4.49  3.87 - 5.11 MIL/uL Final  . Hemoglobin 12/02/2020 13.3  12.0 - 15.0 g/dL Final  . HCT 29/56/2130 41.5  36.0 - 46.0 % Final  . MCV 12/02/2020 92.4  80.0 - 100.0 fL Final  . MCH 12/02/2020 29.6  26.0 - 34.0 pg Final  . MCHC 12/02/2020 32.0  30.0 - 36.0 g/dL Final  . RDW 86/57/8469 13.7  11.5 - 15.5 % Final  . Platelets 12/02/2020 309  150 - 400 K/uL Final  . nRBC 12/02/2020 0.0  0.0 - 0.2 % Final   Performed at [facility], [address] [place], [place] [address]  . Sodium 12/02/2020 139  135 - 145 mmol/L Final  . Potassium 12/02/2020 3.7  3.5 - 5.1 mmol/L Final  . Chloride 12/02/2020 108  98 - 111 mmol/L Final  .  CO2 12/02/2020 23  22 - 32 mmol/L Final  . Glucose, Bld 12/02/2020 99  70 - 99 mg/dL Final   Glucose reference range applies only to samples taken after fasting for at least 8 hours.  . BUN 12/02/2020 12  6 - 20 mg/dL Final  . Creatinine, Ser 12/02/2020 0.62  0.44 - 1.00 mg/dL Final  . Calcium 66/29/4765 9.6  8.9 - 10.3 mg/dL Final  . GFR, Estimated 12/02/2020 >60  >60 mL/min Final   Comment:  (NOTE) Calculated using the CKD-EPI Creatinine Equation (2021)   . Anion gap 12/02/2020 8  5 - 15 Final   Performed at [facility], [address], [place], [place] [address]     X-Rays:No results found.  EKG: Orders placed or performed during the hospital encounter of 08/05/20  . EKG 12-Lead  . EKG 12-Lead     Hospital Course: Megan Robles is a 50 y.o. who was admitted to [facility]. They were brought to the operating room on 01/05/2021 and underwent Procedure(s): TOTAL KNEE REVISION.  Patient tolerated the procedure well and was later transferred to the recovery room and then to the orthopaedic floor for postoperative care. They were given PO and IV analgesics for pain control following their surgery. They were given 24 hours of postoperative antibiotics of  Anti-infectives (From admission, onward)   Start     Dose/Rate Route Frequency Ordered Stop   01/05/21 1700  ceFAZolin (ANCEF) IVPB 2g/100 mL premix        2 g 200 mL/hr over 30 Minutes Intravenous Every 6 hours 01/05/21 1358 01/06/21 0015   01/05/21 0715  ceFAZolin (ANCEF) IVPB 2g/100 mL premix        2 g 200 mL/hr over 30 Minutes Intravenous On call to O.R. 01/05/21 0710 01/05/21 1102     and started on DVT prophylaxis in the form of Aspirin.   PT and OT were ordered for total joint protocol. Discharge planning consulted to help with postop disposition and equipment needs. Patient had a good night on the evening of surgery. They started to get up OOB with therapy on POD #0. Continued working with PT on POD #1 but was limited by nausea. Phenergan added with improvement. Continued to work with therapy into POD #2. Pt was seen during rounds on day two and was ready to go home pending progress with therapy. Pt worked with therapy for one additional session and was meeting their goals. She was discharged to home later that day in stable condition.  Diet: Regular diet Activity: WBAT Follow-up: in 2  weeks Disposition: Home Discharged Condition: good   Discharge Instructions    Call MD / Call 911   Complete by: As directed    If you experience chest pain or shortness of breath, CALL 911 and be transported to the hospital emergency room.  If you develope a fever above 101 F, pus (white drainage) or increased drainage or redness at the wound, or calf pain, call your surgeon's office.   Call MD / Call 911   Complete by: As directed    If you experience chest pain or shortness of breath, CALL 911 and be transported to the hospital emergency room.  If you develope a fever above 101 F, pus (white drainage) or increased drainage or redness at the wound, or calf pain, call your surgeon's office.   Change dressing   Complete by: As directed    Maintain surgical dressing until follow up in  the clinic. If the edges start to pull up, may reinforce with tape. If the dressing is no longer working, may remove and cover with gauze and tape, but must keep the area dry and clean.  Call with any questions or concerns.   Constipation Prevention   Complete by: As directed    Drink plenty of fluids.  Prune juice may be helpful.  You may use a stool softener, such as Colace (over the counter) 100 mg twice a day.  Use MiraLax (over the counter) for constipation as needed.   Constipation Prevention   Complete by: As directed    Drink plenty of fluids.  Prune juice may be helpful.  You may use a stool softener, such as Colace (over the counter) 100 mg twice a day.  Use MiraLax (over the counter) for constipation as needed.   Diet - low sodium heart healthy   Complete by: As directed    Discharge instructions   Complete by: As directed    Maintain surgical dressing until follow up in the clinic. If the edges start to pull up, may reinforce with tape. If the dressing is no longer working, may remove and cover with gauze and tape, but must keep the area dry and clean.  Follow up in 2 weeks at [facility]. Call with  any questions or concerns.   Do not put a pillow under the knee. Place it under the heel.   Complete by: As directed    Driving restrictions   Complete by: As directed    No driving for 6 weeks   Increase activity slowly as tolerated   Complete by: As directed    Weight bearing as tolerated with assist device (walker, cane, etc) as directed, use it as long as suggested by your surgeon or therapist, typically at least 4-6 weeks.   Increase activity slowly as tolerated   Complete by: As directed    Lifting restrictions   Complete by: As directed    No lifting for 6 weeks   TED hose   Complete by: As directed    Use stockings (TED hose) for 2 weeks on both leg(s).  You may remove them at night for sleeping.   TED hose   Complete by: As directed    Use stockings (TED hose) for 2 weeks on both leg(s).  You may remove them at night for sleeping.     Allergies as of 01/07/2021      Reactions   Kiwi Extract Itching, Swelling   Mouth itchy and swelling   Morphine And Related Itching   Hydromorphone Itching   Meperidine Itching      Medication List    TAKE these medications   albuterol 108 (90 Base) MCG/ACT inhaler Commonly known as: VENTOLIN HFA Inhale 1-2 puffs into the lungs every 6 (six) hours as needed for wheezing or shortness of breath.   aspirin 81 MG chewable tablet Chew 1 tablet (81 mg total) by mouth 2 (two) times daily for 28 days.   B-12 PO Take 1 tablet by mouth in the morning.   BIOTIN PO Take 1 tablet by mouth in the morning.   CALCIUM PO Take 1 tablet by mouth in the morning.   carvedilol 6.25 MG tablet Commonly known as: COREG Take 6.25 mg by mouth 2 (two) times daily.   celecoxib 200 MG capsule Commonly known as: CELEBREX Take 1 capsule (200 mg total) by mouth 2 (two) times daily.   cholecalciferol 25 MCG (1000  UNIT) tablet Commonly known as: VITAMIN D3 Take 1,000 Units by mouth daily.   diclofenac sodium 1 % Gel Commonly known as:  VOLTAREN Apply 4 g topically 2 (two) times daily as needed (pain).   docusate sodium 100 MG capsule Commonly known as: COLACE Take 1 capsule (100 mg total) by mouth 2 (two) times daily.   HAIR/SKIN/NAILS PO Take 1 tablet by mouth in the morning.   hydrochlorothiazide 25 MG tablet Commonly known as: HYDRODIURIL Take 25 mg by mouth in the morning.   lisinopril 10 MG tablet Commonly known as: ZESTRIL Take 10 mg by mouth in the morning.   Magnesium 200 MG Tabs Take 400 mg by mouth in the morning, at noon, and at bedtime.   Melatonin 10 MG Caps Take 10 mg by mouth at bedtime as needed (sleep).   methocarbamol 500 MG tablet Commonly known as: ROBAXIN Take 1 tablet (500 mg total) by mouth every 6 (six) hours as needed for muscle spasms.   multivitamin with minerals Tabs tablet Take 2 tablets by mouth in the morning.   oxyCODONE 5 MG immediate release tablet Commonly known as: Oxy IR/ROXICODONE Take 1-2 tablets (5-10 mg total) by mouth every 6 (six) hours as needed for breakthrough pain (post-surgical breakthrough pain).   oxyCODONE-acetaminophen 10-325 MG tablet Commonly known as: PERCOCET Take 1 tablet by mouth 4 (four) times daily as needed for pain.   polyethylene glycol 17 g packet Commonly known as: MIRALAX / GLYCOLAX Take 17 g by mouth daily as needed for mild constipation.   promethazine 12.5 MG tablet Commonly known as: PHENERGAN Take 1 tablet (12.5 mg total) by mouth every 6 (six) hours as needed for nausea or vomiting.   VITAMIN C PO Take 2 tablets by mouth in the morning.            Discharge Care Instructions  (From admission, onward)         Start     Ordered   01/06/21 0000  Change dressing       Comments: Maintain surgical dressing until follow up in the clinic. If the edges start to pull up, may reinforce with tape. If the dressing is no longer working, may remove and cover with gauze and tape, but must keep the area dry and clean.  Call with any  questions or concerns.   01/06/21 6237          Follow-up Information    Durene Romans, MD. Schedule an appointment as soon as possible for a visit in 2 weeks.   Specialty: Orthopedic Surgery Contact information: [address] [place] [address] [phone]               Signed: Dennie Bible, PA-C Orthopedic Surgery 01/16/2021, 8:09 AM

## 2021-06-26 ENCOUNTER — Other Ambulatory Visit: Payer: Self-pay | Admitting: Student

## 2021-06-26 DIAGNOSIS — G8929 Other chronic pain: Secondary | ICD-10-CM

## 2021-07-14 ENCOUNTER — Other Ambulatory Visit: Payer: Self-pay

## 2021-07-14 ENCOUNTER — Ambulatory Visit
Admission: RE | Admit: 2021-07-14 | Discharge: 2021-07-14 | Disposition: A | Discharge status: Home / Self Care | Payer: Medicare Other | Source: Ambulatory Visit | Attending: Student | Admitting: Student

## 2021-07-14 DIAGNOSIS — G8929 Other chronic pain: Secondary | ICD-10-CM

## 2021-07-14 MED ORDER — IOPAMIDOL (ISOVUE-M 200) INJECTION 41%
1.0000 mL | Freq: Once | INTRAMUSCULAR | Status: AC
  Administered 2021-07-14: 1 mL via EPIDURAL

## 2021-07-14 MED ORDER — METHYLPREDNISOLONE ACETATE 40 MG/ML INJ SUSP (RADIOLOG
80.0000 mg | Freq: Once | INTRAMUSCULAR | Status: AC
  Administered 2021-07-14: 80 mg via EPIDURAL

## 2021-07-14 NOTE — Discharge Instructions (Signed)

## 2021-07-19 ENCOUNTER — Telehealth: Payer: Self-pay

## 2021-07-19 NOTE — Telephone Encounter (Signed)
I returned patient's call to [place] from this morning.  She states she is in significant pain since her NRB here this past Friday.  She complains of constant left foot pain and throbbing with numbness, and she states the only symptom she has before this injection was occasional tingling in some of her toes.  She did tell me she has an appt with Dr. Jillyn Hidden tomorrow because of the severity of her symptoms.

## 2022-05-16 ENCOUNTER — Other Ambulatory Visit: Payer: Self-pay | Admitting: Orthopedic Surgery

## 2022-05-16 DIAGNOSIS — M5136 Other intervertebral disc degeneration, lumbar region: Secondary | ICD-10-CM

## 2022-05-25 ENCOUNTER — Ambulatory Visit
Admission: RE | Admit: 2022-05-25 | Discharge: 2022-05-25 | Disposition: A | Discharge status: Home / Self Care | Payer: 59 | Source: Ambulatory Visit | Attending: Orthopedic Surgery | Admitting: Orthopedic Surgery

## 2022-05-25 ENCOUNTER — Other Ambulatory Visit: Payer: Self-pay | Admitting: Orthopedic Surgery

## 2022-05-25 DIAGNOSIS — M5136 Other intervertebral disc degeneration, lumbar region: Secondary | ICD-10-CM

## 2022-05-25 MED ORDER — IOPAMIDOL (ISOVUE-M 200) INJECTION 41%
1.0000 mL | Freq: Once | INTRAMUSCULAR | Status: AC
  Administered 2022-05-25: 1 mL via INTRA_ARTICULAR

## 2022-05-25 MED ORDER — METHYLPREDNISOLONE ACETATE 40 MG/ML INJ SUSP (RADIOLOG
80.0000 mg | Freq: Once | INTRAMUSCULAR | Status: AC
  Administered 2022-05-25: 80 mg via INTRA_ARTICULAR

## 2022-05-25 NOTE — Discharge Instructions (Signed)

## 2023-07-03 ENCOUNTER — Other Ambulatory Visit (HOSPITAL_COMMUNITY): Payer: Self-pay | Admitting: *Deleted

## 2023-07-03 DIAGNOSIS — R072 Precordial pain: Secondary | ICD-10-CM

## 2023-09-03 ENCOUNTER — Encounter (HOSPITAL_COMMUNITY): Payer: Self-pay

## 2024-05-04 ENCOUNTER — Other Ambulatory Visit: Payer: Self-pay

## 2024-05-04 ENCOUNTER — Encounter (HOSPITAL_BASED_OUTPATIENT_CLINIC_OR_DEPARTMENT_OTHER): Payer: Self-pay | Admitting: Orthopedic Surgery

## 2024-05-05 ENCOUNTER — Encounter (HOSPITAL_BASED_OUTPATIENT_CLINIC_OR_DEPARTMENT_OTHER)
Admission: RE | Admit: 2024-05-05 | Discharge: 2024-05-05 | Disposition: A | Discharge status: Home / Self Care | Source: Ambulatory Visit | Attending: Orthopedic Surgery | Admitting: Orthopedic Surgery

## 2024-05-05 DIAGNOSIS — Z01810 Encounter for preprocedural cardiovascular examination: Secondary | ICD-10-CM | POA: Diagnosis present

## 2024-05-05 DIAGNOSIS — Z01818 Encounter for other preprocedural examination: Secondary | ICD-10-CM | POA: Insufficient documentation

## 2024-05-05 DIAGNOSIS — R9431 Abnormal electrocardiogram [ECG] [EKG]: Secondary | ICD-10-CM | POA: Insufficient documentation

## 2024-05-05 DIAGNOSIS — Z01812 Encounter for preprocedural laboratory examination: Secondary | ICD-10-CM | POA: Diagnosis present

## 2024-05-05 LAB — BASIC METABOLIC PANEL WITH GFR
Anion gap: 11 (ref 5–15)
BUN: 18 mg/dL (ref 6–20)
CO2: 21 mmol/L — ABNORMAL LOW (ref 22–32)
Calcium: 9 mg/dL (ref 8.9–10.3)
Chloride: 105 mmol/L (ref 98–111)
Creatinine, Ser: 0.76 mg/dL (ref 0.44–1.00)
GFR, Estimated: >60 mL/min (ref >60)
Glucose, Bld: 106 mg/dL — ABNORMAL HIGH (ref 70–99)
Potassium: 4.3 mmol/L (ref 3.5–5.1)
Sodium: 137 mmol/L (ref 135–145)

## 2024-05-05 NOTE — Progress Notes (Signed)

## 2024-05-08 ENCOUNTER — Ambulatory Visit (HOSPITAL_BASED_OUTPATIENT_CLINIC_OR_DEPARTMENT_OTHER): Admitting: Certified Registered"

## 2024-05-08 ENCOUNTER — Encounter (HOSPITAL_BASED_OUTPATIENT_CLINIC_OR_DEPARTMENT_OTHER)
Admission: RE | Disposition: A | Discharge status: Home / Self Care | Payer: Self-pay | Source: Home / Self Care | Attending: Orthopedic Surgery

## 2024-05-08 ENCOUNTER — Ambulatory Visit (HOSPITAL_BASED_OUTPATIENT_CLINIC_OR_DEPARTMENT_OTHER)
Admission: RE | Admit: 2024-05-08 | Discharge: 2024-05-08 | Disposition: A | Discharge status: Home / Self Care | Attending: Orthopedic Surgery | Admitting: Orthopedic Surgery

## 2024-05-08 ENCOUNTER — Other Ambulatory Visit: Payer: Self-pay

## 2024-05-08 ENCOUNTER — Ambulatory Visit (HOSPITAL_COMMUNITY)

## 2024-05-08 ENCOUNTER — Encounter (HOSPITAL_BASED_OUTPATIENT_CLINIC_OR_DEPARTMENT_OTHER): Payer: Self-pay | Admitting: Orthopedic Surgery

## 2024-05-08 DIAGNOSIS — S83242A Other tear of medial meniscus, current injury, left knee, initial encounter: Secondary | ICD-10-CM | POA: Insufficient documentation

## 2024-05-08 DIAGNOSIS — E669 Obesity, unspecified: Secondary | ICD-10-CM | POA: Diagnosis not present

## 2024-05-08 DIAGNOSIS — M797 Fibromyalgia: Secondary | ICD-10-CM | POA: Diagnosis not present

## 2024-05-08 DIAGNOSIS — X58XXXA Exposure to other specified factors, initial encounter: Secondary | ICD-10-CM | POA: Insufficient documentation

## 2024-05-08 DIAGNOSIS — M84362A Stress fracture, left tibia, initial encounter for fracture: Secondary | ICD-10-CM

## 2024-05-08 DIAGNOSIS — Z01818 Encounter for other preprocedural examination: Secondary | ICD-10-CM

## 2024-05-08 DIAGNOSIS — I10 Essential (primary) hypertension: Secondary | ICD-10-CM

## 2024-05-08 DIAGNOSIS — M94262 Chondromalacia, left knee: Secondary | ICD-10-CM | POA: Insufficient documentation

## 2024-05-08 DIAGNOSIS — Z6841 Body Mass Index (BMI) 40.0 and over, adult: Secondary | ICD-10-CM | POA: Diagnosis not present

## 2024-05-08 HISTORY — PX: KNEE ARTHROSCOPY WITH SUBCHONDROPLASTY: SHX6732

## 2024-05-08 HISTORY — PX: KNEE ARTHROSCOPY WITH MEDIAL MENISECTOMY: SHX5651

## 2024-05-08 SURGERY — ARTHROSCOPY, KNEE, WITH MEDIAL MENISCECTOMY
Anesthesia: General | Site: Knee | Laterality: Left

## 2024-05-08 MED ORDER — ONDANSETRON HCL 4 MG/2ML IJ SOLN
INTRAMUSCULAR | Status: DC | PRN
  Administered 2024-05-08: 4 mg via INTRAVENOUS

## 2024-05-08 MED ORDER — SUGAMMADEX SODIUM 200 MG/2ML IV SOLN
INTRAVENOUS | Status: DC | PRN
  Administered 2024-05-08: 250 mg via INTRAVENOUS

## 2024-05-08 MED ORDER — LIDOCAINE 2% (20 MG/ML) 5 ML SYRINGE
INTRAMUSCULAR | Status: AC
  Filled 2024-05-08: qty 5

## 2024-05-08 MED ORDER — DEXAMETHASONE SODIUM PHOSPHATE 10 MG/ML IJ SOLN
INTRAMUSCULAR | Status: AC
  Filled 2024-05-08: qty 1

## 2024-05-08 MED ORDER — SUCCINYLCHOLINE CHLORIDE 200 MG/10ML IV SOSY
PREFILLED_SYRINGE | INTRAVENOUS | Status: DC | PRN
  Administered 2024-05-08: 140 mg via INTRAVENOUS

## 2024-05-08 MED ORDER — MIDAZOLAM HCL 2 MG/2ML IJ SOLN
INTRAMUSCULAR | Status: DC | PRN
Start: 2024-05-08 — End: 2024-05-08
  Administered 2024-05-08: 2 mg via INTRAVENOUS

## 2024-05-08 MED ORDER — LACTATED RINGERS IV SOLN: INTRAVENOUS | Status: DC

## 2024-05-08 MED ORDER — ROCURONIUM BROMIDE 10 MG/ML (PF) SYRINGE
PREFILLED_SYRINGE | INTRAVENOUS | Status: AC
  Filled 2024-05-08: qty 10

## 2024-05-08 MED ORDER — CEFAZOLIN SODIUM-DEXTROSE 2-4 GM/100ML-% IV SOLN
2.0000 g | INTRAVENOUS | Status: AC
  Administered 2024-05-08: 2 g via INTRAVENOUS

## 2024-05-08 MED ORDER — SUCCINYLCHOLINE CHLORIDE 200 MG/10ML IV SOSY
PREFILLED_SYRINGE | INTRAVENOUS | Status: AC
  Filled 2024-05-08: qty 10

## 2024-05-08 MED ORDER — DEXAMETHASONE SODIUM PHOSPHATE 10 MG/ML IJ SOLN
INTRAMUSCULAR | Status: DC | PRN
  Administered 2024-05-08: 5 mg via INTRAVENOUS

## 2024-05-08 MED ORDER — OXYCODONE HCL 5 MG PO TABS
ORAL_TABLET | ORAL | Status: AC
  Filled 2024-05-08: qty 1

## 2024-05-08 MED ORDER — LIDOCAINE 2% (20 MG/ML) 5 ML SYRINGE
INTRAMUSCULAR | Status: DC | PRN
Start: 2024-05-08 — End: 2024-05-08
  Administered 2024-05-08: 40 mg via INTRAVENOUS

## 2024-05-08 MED ORDER — ONDANSETRON HCL 4 MG/2ML IJ SOLN: 4.0000 mg | Freq: Four times a day (QID) | INTRAMUSCULAR | Status: DC | PRN

## 2024-05-08 MED ORDER — OXYCODONE HCL 5 MG/5ML PO SOLN: 5.0000 mg | Freq: Once | ORAL | Status: AC | PRN

## 2024-05-08 MED ORDER — PROPOFOL 10 MG/ML IV BOLUS
INTRAVENOUS | Status: DC | PRN
  Administered 2024-05-08: 200 mg via INTRAVENOUS

## 2024-05-08 MED ORDER — FENTANYL CITRATE (PF) 100 MCG/2ML IJ SOLN
INTRAMUSCULAR | Status: DC | PRN
  Administered 2024-05-08: 100 ug via INTRAVENOUS

## 2024-05-08 MED ORDER — ONDANSETRON 4 MG PO TBDP: 4.0000 mg | ORAL_TABLET | Freq: Three times a day (TID) | ORAL | 0 refills | Status: AC | PRN

## 2024-05-08 MED ORDER — FENTANYL CITRATE (PF) 100 MCG/2ML IJ SOLN
INTRAMUSCULAR | Status: AC
  Filled 2024-05-08: qty 2

## 2024-05-08 MED ORDER — ONDANSETRON HCL 4 MG/2ML IJ SOLN
INTRAMUSCULAR | Status: AC
  Filled 2024-05-08: qty 2

## 2024-05-08 MED ORDER — CEFAZOLIN SODIUM-DEXTROSE 2-4 GM/100ML-% IV SOLN
INTRAVENOUS | Status: AC
  Filled 2024-05-08: qty 100

## 2024-05-08 MED ORDER — ROCURONIUM BROMIDE 10 MG/ML (PF) SYRINGE
PREFILLED_SYRINGE | INTRAVENOUS | Status: DC | PRN
  Administered 2024-05-08: 30 mg via INTRAVENOUS

## 2024-05-08 MED ORDER — BUPIVACAINE HCL 0.25 % IJ SOLN
INTRAMUSCULAR | Status: DC | PRN
  Administered 2024-05-08: 20 mL

## 2024-05-08 MED ORDER — FENTANYL CITRATE (PF) 100 MCG/2ML IJ SOLN
25.0000 ug | INTRAMUSCULAR | Status: DC | PRN
  Administered 2024-05-08 (×3): 50 ug via INTRAVENOUS

## 2024-05-08 MED ORDER — FENTANYL CITRATE (PF) 100 MCG/2ML IJ SOLN
INTRAMUSCULAR | Status: AC
Start: 2024-05-08 — End: 2024-05-08
  Filled 2024-05-08: qty 2

## 2024-05-08 MED ORDER — OXYCODONE HCL 5 MG PO TABS: 5.0000 mg | ORAL_TABLET | Freq: Four times a day (QID) | ORAL | 0 refills | Status: AC | PRN

## 2024-05-08 MED ORDER — OXYCODONE HCL 5 MG PO TABS
5.0000 mg | ORAL_TABLET | Freq: Once | ORAL | Status: AC | PRN
  Administered 2024-05-08: 5 mg via ORAL

## 2024-05-08 MED ORDER — MIDAZOLAM HCL 2 MG/2ML IJ SOLN
INTRAMUSCULAR | Status: AC
  Filled 2024-05-08: qty 2

## 2024-05-08 MED ORDER — APIXABAN 2.5 MG PO TABS: 2.5000 mg | ORAL_TABLET | Freq: Two times a day (BID) | ORAL | 0 refills | Status: AC

## 2024-05-08 SURGICAL SUPPLY — 35 items
BLADE SHAVER TORPEDO 4X13 (MISCELLANEOUS) ×1 IMPLANT
BNDG COMPR ESMARK 6X3 LF (GAUZE/BANDAGES/DRESSINGS) IMPLANT
BNDG ELASTIC 6INX 5YD STR LF (GAUZE/BANDAGES/DRESSINGS) ×1 IMPLANT
BURR OVAL 8 FLU 4.0X13 (MISCELLANEOUS) IMPLANT
BURR OVAL 8 FLU 5.0X13 (MISCELLANEOUS) IMPLANT
CANNULA ACCUPORT 11G X 120 (CANNULA) IMPLANT
CLSR STERI-STRIP ANTIMIC 1/2X4 (GAUZE/BANDAGES/DRESSINGS) ×1 IMPLANT
COVER MAYO STAND STRL (DRAPES) ×1 IMPLANT
CUFF TRNQT CYL 34X4.125X (TOURNIQUET CUFF) IMPLANT
CUTTER BONE 4.0MM X 13CM (MISCELLANEOUS) IMPLANT
DRAPE C-ARM 35X43 STRL (DRAPES) ×1 IMPLANT
DRAPE INCISE IOBAN 66X45 STRL (DRAPES) IMPLANT
DRAPE U-SHAPE 47X51 STRL (DRAPES) ×1 IMPLANT
DRAPE-T ARTHROSCOPY W/POUCH (DRAPES) ×1 IMPLANT
DURAPREP 26ML APPLICATOR (WOUND CARE) ×1 IMPLANT
ELECTRODE REM PT RTRN 9FT ADLT (ELECTROSURGICAL) IMPLANT
GAUZE PAD ABD 8X10 STRL (GAUZE/BANDAGES/DRESSINGS) ×1 IMPLANT
GAUZE SPONGE 4X4 12PLY STRL (GAUZE/BANDAGES/DRESSINGS) ×1 IMPLANT
GAUZE XEROFORM 1X8 LF (GAUZE/BANDAGES/DRESSINGS) ×1 IMPLANT
GLOVE BIO SURGEON STRL SZ7.5 (GLOVE) ×2 IMPLANT
GLOVE BIOGEL PI IND STRL 8 (GLOVE) ×2 IMPLANT
GOWN STRL REUS W/ TWL LRG LVL3 (GOWN DISPOSABLE) ×1 IMPLANT
GOWN STRL REUS W/ TWL XL LVL3 (GOWN DISPOSABLE) ×1 IMPLANT
GOWN STRL REUS W/TWL XL LVL3 (GOWN DISPOSABLE) ×2 IMPLANT
GRAFT FILLER BONE 5ML (Knees) IMPLANT
MANIFOLD NEPTUNE II (INSTRUMENTS) ×1 IMPLANT
PACK ARTHROSCOPY DSU (CUSTOM PROCEDURE TRAY) ×1 IMPLANT
PACK BASIN DAY SURGERY FS (CUSTOM PROCEDURE TRAY) ×1 IMPLANT
SHEET MEDIUM DRAPE 40X70 STRL (DRAPES) ×1 IMPLANT
SUT ETHILON 4 0 PS 2 18 (SUTURE) ×1 IMPLANT
SUT MNCRL AB 3-0 PS2 18 (SUTURE) ×1 IMPLANT
TOWEL GREEN STERILE FF (TOWEL DISPOSABLE) ×1 IMPLANT
TUBE CONNECTING 20X1/4 (TUBING) IMPLANT
TUBING ARTHROSCOPY IRRIG 16FT (MISCELLANEOUS) ×1 IMPLANT
WAND ABLATOR APOLLO I90 (BUR) IMPLANT

## 2024-05-08 NOTE — Op Note (Signed)
 Surgeon(s): [name], MD  Assistant: Dayle Moores, PA-C  Assistant attestation:  PA Mcclung scrubbed and present for the entire procedure.   ANESTHESIA:  general, and regional   FLUIDS: Per anesthesia record.    ESTIMATED BLOOD LOSS: minimal     PREOPERATIVE DIAGNOSES:  1. Left knee medial meniscus tear 2.  Left medial tibial plateau stress fracture 3.   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  Left knee arthroscopically aided treatment of medial tibial plateau stress fracture with percutaneous internal fixation 2.  Left knee arthroscopic chondroplasty medial femoral condyle   Implant: Flowable calcium phosphate, 5 mL. Zimmer   DESCRIPTION OF PROCEDURE: The patient has a left knee medial meniscus tear. They have had pain that has been refractory to conservative management. Their preoperative MRI demonstrated subchondral bone marrow edema and insufficiency fractures of the medial tibial plateau as well as the medial meniscus tear. Plans are to proceed with partial medial meniscectomy, internal fixation of subchondral insufficiency fractures with flowable calcium phosphate, and diagnostic arthroscopy with debridement as indicated. Full discussion held regarding risks benefits alternatives and complications related surgical intervention. Conservative care options reviewed. All questions answered.   The patient was identified in the preoperative holding area and the operative extremity was marked. The patient was brought to the operating room and transferred to operating table in a supine position. Satisfactory general anesthesia was induced by anesthesiology.     Standard anterolateral, anteromedial arthroscopy portals were obtained. The anteromedial portal was obtained with a spinal needle for localization under direct visualization with subsequent diagnostic findings.     Anteromedial and anterolateral chambers: moderate synovitis. The synovitis was debrided with a 4.5 mm full radius shaver through both the anteromedial and lateral portals.    Suprapatellar pouch and gutters: mild synovitis or debris. Patella chondral surface: Grade 1 Trochlear chondral surface: Grade 0 Patellofemoral tracking: level Medial meniscus: Free edge degenerative tearing in the white zone mid body nothing that propagated to the red-white zone or red zone Medial femoral condyle flexion bearing surface: Grade 3 Medial femoral condyle extension bearing surface: Grade 2 Medial tibial plateau: Grade 2 Anterior cruciate ligament:stable Posterior cruciate ligament:stable Lateral meniscus: no tear.   Lateral femoral condyle flexion bearing surface: Grade 1 Lateral femoral condyle extension bearing surface: Grade 0 Lateral tibial plateau: Grade 1   Medial meniscus was closely inspected and found to have no real structural tear.  Just degenerative free edge tearing.   Chondroplasty was achieved on the medial femoral condyle using a motorized shaver to debride the grade 3 unstable cartilage. Completion of the chondroplasty left A medial femoral condyle with smooth stable surface. There was no full-thickness component noted.   Next we turned our attention to the internal fixation of the medial tibial condyle. Arthroscopically we evaluated medial tibial plateau cartilage and noted there was no loose cartilage or debris surrounding the lesion and the fracture did not propagate to the joint surface. Using preoperative MRI we targeted the delivery device to just under the subchondral density and in the stress fracture of the medial tibial plateau. This was achieved with intraoperative fluoroscopy. Once accurate placement was noted on 2 views and confirmed we delivered 5 mL of flowable calcium phosphate into the stress fracture lesion of the medial tibial plateau. We left the cannulas in place for  approximately 8 minutes while the implant hardened. We removed the cannulas and again took 2 views  of fluoroscopic pictures to confirm there was no extravasation outside of the bone. There was none noted.   After completion of synovectomy, diagnostic exam, and debridements as described, all compartments were checked and no residual debris remained. Hemostasis was achieved with the cautery wand. The portals were approximated with nylon suture. All excess fluid was expressed from the joint.  Xeroform sterile gauze dressings were applied followed by Ace bandage and ice pack.    There were no immediate competitions and all counts were correct.   DISPOSITION: The patient was awakened from general anesthetic, extubated, taken to the recovery room in medically stable condition, no apparent complications. The patient may be weightbearing as tolerated to the operative lower extremity with crutches.  Range of motion of right knee as tolerated.  They will use 2.5 mg of Eliquis  twice daily x 28 days for DVT prophylaxis.  Return to see me in the office in 2 weeks   [name]

## 2024-05-08 NOTE — Anesthesia Procedure Notes (Signed)
 Procedure Name: Intubation Date/Time: 05/08/2024 2:09 PM  Performed by: Delayne Olam BIRCH, CRNAPre-anesthesia Checklist: Patient identified, Emergency Drugs available, Suction available and Patient being monitored Patient Re-evaluated:Patient Re-evaluated prior to induction Oxygen Delivery Method: Circle system utilized Preoxygenation: Pre-oxygenation with 100% oxygen Induction Type: IV induction and Rapid sequence Ventilation: Mask ventilation without difficulty Laryngoscope Size: Mac and 4 Grade View: Grade I Tube type: Oral Tube size: 7.0 mm Number of attempts: 1 Airway Equipment and Method: Stylet and Oral airway Placement Confirmation: ETT inserted through vocal cords under direct vision, positive ETCO2 and breath sounds checked- equal and bilateral Secured at: 22 cm Tube secured with: Tape Dental Injury: Teeth and Oropharynx as per pre-operative assessment

## 2024-05-08 NOTE — Progress Notes (Signed)
 Time last received oxycodone  written on dc paperwork.

## 2024-05-08 NOTE — Transfer of Care (Signed)
 Immediate Anesthesia Transfer of Care Note  Patient: Megan Robles  Procedure(s) Performed: Procedure(s) (LRB): ARTHROSCOPY, KNEE, WITH MEDIAL MENISCECTOMY (Left) ARTHROSCOPY, KNEE, WITH SUBCHONDROPLASTY (Left)  Patient Location: PACU  Anesthesia Type: General  Level of Consciousness: awake, oriented, sedated and patient cooperative  Airway & Oxygen Therapy: Patient Spontanous Breathing Room Air  Post-op Assessment: Report given to PACU RN and Post -op Vital signs reviewed and stable  Post vital signs: Reviewed and stable  Complications: No apparent anesthesia complications  Last Vitals:  Vitals Value Taken Time  BP 112/95 05/08/24 15:00  Temp    Pulse 92 05/08/24 15:02  Resp 10 05/08/24 15:02  SpO2 98 % 05/08/24 15:02  Vitals shown include unfiled device data.  Last Pain:  Vitals:   05/08/24 1219  TempSrc: Temporal  PainSc: 0-No pain      Patients Stated Pain Goal: 3 (05/08/24 1219)  Complications: No notable events documented.

## 2024-05-08 NOTE — Anesthesia Preprocedure Evaluation (Signed)
 Anesthesia Evaluation  Patient identified by MRN, date of birth, ID band Patient awake    Reviewed: Allergy & Precautions, H&P , NPO status , Patient's Chart, lab work & pertinent test results  Airway Mallampati: II   Neck ROM: full    Dental   Pulmonary neg pulmonary ROS   breath sounds clear to auscultation       Cardiovascular hypertension,  Rhythm:regular Rate:Normal     Neuro/Psych  PSYCHIATRIC DISORDERS Anxiety Depression     Neuromuscular disease    GI/Hepatic   Endo/Other    Class 4 obesity  Renal/GU      Musculoskeletal  (+) Arthritis ,  Fibromyalgia -  Abdominal   Peds  Hematology   Anesthesia Other Findings   Reproductive/Obstetrics                              Anesthesia Physical Anesthesia Plan  ASA: 3  Anesthesia Plan: General   Post-op Pain Management:    Induction: Intravenous  PONV Risk Score and Plan: 3 and Ondansetron , Dexamethasone , Midazolam  and Treatment may vary due to age or medical condition  Airway Management Planned: Oral ETT  Additional Equipment:   Intra-op Plan:   Post-operative Plan: Extubation in OR  Informed Consent: I have reviewed the patients History and Physical, chart, labs and discussed the procedure including the risks, benefits and alternatives for the proposed anesthesia with the patient or authorized representative who has indicated his/her understanding and acceptance.     Dental advisory given  Plan Discussed with: CRNA, Anesthesiologist and Surgeon  Anesthesia Plan Comments:         Anesthesia Quick Evaluation

## 2024-05-08 NOTE — Discharge Instructions (Addendum)
 Post-operative patient instructions  Knee Arthroscopy   Ice:  Place intermittent ice or cooler pack over your knee, 30 minutes on and 30 minutes off.  Continue this for the first 72 hours after surgery, then save ice for use after therapy sessions or on more active days.   Weight:  You may bear weight on your leg as your symptoms allow. DVT prevention: Perform ankle pumps as able throughout the day on the operative extremity.  Be mobile as possible with ambulation as able.  You should also take  2.5 mg of Eliquis  bid x 28 days Crutches:  Use crutches (or walker) to assist in walking until told to discontinue by your physical therapist or physician. This will help to reduce pain. Strengthening:  Perform simple thigh squeezes (isometric quad contractions) and straight leg lifts as you are able (3 sets of 5 to 10 repetitions, 3 times a day).  For the leg lifts, have someone support under your ankle in the beginning until you have increased strength enough to do this on your own.  To help get started on thigh squeezes, place a pillow under your knee and push down on the pillow with back of knee (sometimes easier to do than with your leg fully straight). Motion:  Perform gentle knee motion as tolerated - this is gentle bending and straightening of the knee. Seated heel slides: you can start by sitting in a chair, remove your brace, and gently slide your heel back on the floor - allowing your knee to bend. Have someone help you straighten your knee (or use your other leg/foot hooked under your ankle.  Dressing:  Perform 1st dressing change at 3 days postoperative. A moderate amount of blood tinged drainage is to be expected.  So if you bleed through the dressing on the first or second day or if you have fevers, it is fine to change the dressing/check the wounds early and redress wound. Elevate your leg.  If it bleeds through again, or if the incisions are leaking frank blood, please call the office. May change  dressing every 1-2 days thereafter to help watch wounds. Can purchase Tegderm (or 52M Nexcare) water  resistant dressings at local pharmacy / [employer]. Shower:  Light shower is ok after 3 days.  Please take shower, NO bath. Recover with gauze and ace wrap to help keep wounds protected.   Pain medication:  A narcotic pain medication has been prescribed.  Take as directed.  Typically you need narcotic pain medication more regularly during the first 3 to 5 days after surgery.  Decrease your use of the medication as the pain improves.  Narcotics can sometimes cause constipation, even after a few doses.  If you have problems with constipation, you can take an over the counter stool softener or light laxative.  If you have persistent problems, please notify your physician's office. Physical therapy: Additional activity guidelines to be provided by your physician or physical therapist at follow-up visits.  Driving: Do not recommend driving x 1-2 weeks post surgical, especially if surgery performed on right side. Should not drive while taking narcotic pain medications. It typically takes at least 2 weeks to restore sufficient neuromuscular function for normal reaction times for driving safety.  Call [phone] for questions or problems. Evenings you will be forwarded to the hospital operator.  Ask for the orthopaedic physician on call. Please call if you experience:    Redness, foul smelling, or persistent drainage from the surgical site  worsening knee pain and  swelling not responsive to medication  any calf pain and or swelling of the lower leg  temperatures greater than 101.5 F other questions or concerns   Thank you for allowing us  to be a part of your care    Post Anesthesia Home Care Instructions  Activity: Get plenty of rest for the remainder of the day. A responsible individual must stay with you for 24 hours following the procedure.  For the next 24 hours, DO NOT: -Drive a car -Social worker -Drink alcoholic beverages -Take any medication unless instructed by your physician -Make any legal decisions or sign important papers.  Meals: Start with liquid foods such as gelatin or soup. Progress to regular foods as tolerated. Avoid greasy, spicy, heavy foods. If nausea and/or vomiting occur, drink only clear liquids until the nausea and/or vomiting subsides. Call your physician if vomiting continues.  Special Instructions/Symptoms: Your throat may feel dry or sore from the anesthesia or the breathing tube placed in your throat during surgery. If this causes discomfort, gargle with warm salt water . The discomfort should disappear within 24 hours.  If you had a scopolamine patch placed behind your ear for the management of post- operative nausea and/or vomiting:  1. The medication in the patch is effective for 72 hours, after which it should be removed.  Wrap patch in a tissue and discard in the trash. Wash hands thoroughly with soap and water . 2. You may remove the patch earlier than 72 hours if you experience unpleasant side effects which may include dry mouth, dizziness or visual disturbances. 3. Avoid touching the patch. Wash your hands with soap and water  after contact with the patch.

## 2024-05-08 NOTE — Brief Op Note (Signed)
 05/08/2024  2:42 PM  PATIENT:  Megan Robles  53 y.o. female  PRE-OPERATIVE DIAGNOSIS:  Left knee medial meniscus tear, stress fracture medial tibia plateau  POST-OPERATIVE DIAGNOSIS:  Left knee medial meniscus tear, stress fracture medial tibia plateau  PROCEDURE:  Procedure(s) with comments: 1.  Left knee arthroscopic chondroplasty medial femoral condyle 2.  Left knee percutaneous internal fixation medial tibial plateau fracture  SURGEON:  Surgeons and Role:    * Sharl, Selinda Dover, MD - Primary  PHYSICIAN ASSISTANT: [place], PA-C   ANESTHESIA:   local and general  EBL:   10 cc  BLOOD ADMINISTERED:none  DRAINS: none   LOCAL MEDICATIONS USED:  MARCAINE      SPECIMEN:  No Specimen  DISPOSITION OF SPECIMEN:  N/A  COUNTS:  YES  TOURNIQUET:  * No tourniquets in log *  DICTATION: .Note written in EPIC  PLAN OF CARE: Discharge to home after PACU  PATIENT DISPOSITION:  PACU - hemodynamically stable.   Delay start of Pharmacological VTE agent (>24hrs) due to surgical blood loss or risk of bleeding: not applicable

## 2024-05-08 NOTE — H&P (Signed)
 ORTHOPAEDIC H&P  REQUESTING PHYSICIAN: Sharl Selinda Dover, MD  PCP:  Ollis Netter, PA  Chief Complaint: Left knee pain  HPI: Megan Robles is a 53 y.o. female who complains of left knee pain including mechanical symptoms and recalcitrant pain to conservative treatment.  Here today for arthroscopic assisted surgery with internal fixation of medial tibial plateau stress fracture.  Past Medical History:  Diagnosis Date   Anemia 2004   Anxiety    Arthritis    Depression    Fibromyalgia    Heart murmur    History of blood transfusion 2004/2015   Hypertension    Pneumonia 2004   URI (upper respiratory infection) 03/19/14   productive cough with yellow nasal discharge with fever- did not seek treatment- 7/17/15states resolved except occ cough now   Past Surgical History:  Procedure Laterality Date   ABDOMINAL HYSTERECTOMY  2016   CHOLECYSTECTOMY  2004   COLONOSCOPY  02/15/2012   Procedure: COLONOSCOPY;  Surgeon: Dover JONETTA Just, MD;  Location: [facility];  Service: Endoscopy;  Laterality: N/A;   DILATION AND CURETTAGE OF UTERUS  2004   ESOPHAGOGASTRODUODENOSCOPY  02/15/2012   Procedure: ESOPHAGOGASTRODUODENOSCOPY (EGD);  Surgeon: Dover JONETTA Just, MD;  Location: [facility];  Service: Endoscopy;  Laterality: N/A;   LAPAROSCOPIC GASTRIC SLEEVE RESECTION  2016   TOTAL HIP ARTHROPLASTY Left 01/26/2014   Procedure: LEFT TOTAL HIP ARTHROPLASTY ANTERIOR APPROACH;  Surgeon: Donnice JONETTA Car, MD;  Location: [facility];  Service: Orthopedics;  Laterality: Left;   TOTAL KNEE ARTHROPLASTY Right 04/13/2014   Procedure: RIGHT TOTAL KNEE ARTHROPLASTY;  Surgeon: Donnice JONETTA Car, MD;  Location: [facility];  Service: Orthopedics;  Laterality: Right;   TOTAL KNEE REVISION Right 01/05/2021   Procedure: TOTAL KNEE REVISION;  Surgeon: Car Donnice, MD;  Location: [facility];  Service: Orthopedics;  Laterality: Right;  90 min   Social History   Socioeconomic History   Marital status: Single    Spouse  name: Not on file   Number of children: Not on file   Years of education: Not on file   Highest education level: Not on file  Occupational History   Not on file  Tobacco Use   Smoking status: Never   Smokeless tobacco: Never  Vaping Use   Vaping status: Never Used  Substance and Sexual Activity   Alcohol use: No   Drug use: No   Sexual activity: Never    Birth control/protection: Surgical  Other Topics Concern   Not on file  Social History Narrative   Not on file   Social Drivers of Health   Financial Resource Strain: Not on file  Food Insecurity: Not on file  Transportation Needs: Not on file  Physical Activity: Not on file  Stress: Not on file  Social Connections: Unknown (06/24/2023)   Received from [facility]   Social Network    Social Network: Not on file   Family History  Problem Relation Age of Onset   COPD Mother    Allergies  Allergen Reactions   Kiwi Extract Itching and Swelling    Mouth itchy and swelling   Morphine  And Codeine Itching   Hydromorphone  Itching   Meperidine  Itching   Prior to Admission medications   Medication Sig Start Date End Date Taking? Authorizing Provider  albuterol  (VENTOLIN  HFA) 108 (90 Base) MCG/ACT inhaler Inhale 1-2 puffs into the lungs every 6 (six) hours as needed for wheezing or shortness of breath.   Yes [provider]  carvedilol  (COREG )  6.25 MG tablet Take 6.25 mg by mouth 2 (two) times daily.   Yes [provider]  diclofenac (VOLTAREN) 75 MG EC tablet Take 75 mg by mouth 2 (two) times daily.   Yes [provider]  hydrochlorothiazide  (HYDRODIURIL ) 25 MG tablet Take 25 mg by mouth in the morning.   Yes [provider]  lisinopril  (PRINIVIL ,ZESTRIL ) 10 MG tablet Take 10 mg by mouth in the morning.   Yes [provider]  oxyCODONE  (OXY IR/ROXICODONE ) 5 MG immediate release tablet Take 1-2 tablets (5-10 mg total) by mouth every 6 (six) hours as needed for breakthrough pain  (post-surgical breakthrough pain). 01/07/21  Yes Logan Ubaldo NOVAK, PA-C  oxyCODONE  (XTAMPZA  ER PO) Take by mouth.   Yes [provider]  Ascorbic Acid (VITAMIN C PO) Take 2 tablets by mouth in the morning.    [provider]  BIOTIN PO Take 1 tablet by mouth in the morning.    [provider]  Biotin w/ Vitamins C & E (HAIR/SKIN/NAILS PO) Take 1 tablet by mouth in the morning.    [provider]  CALCIUM PO Take 1 tablet by mouth in the morning.    [provider]  cholecalciferol (VITAMIN D3) 25 MCG (1000 UNIT) tablet Take 1,000 Units by mouth daily.    [provider]  Cyanocobalamin (B-12 PO) Take 1 tablet by mouth in the morning.    [provider]  diclofenac sodium (VOLTAREN) 1 % GEL Apply 4 g topically 2 (two) times daily as needed (pain).    [provider]  docusate sodium  (COLACE) 100 MG capsule Take 1 capsule (100 mg total) by mouth 2 (two) times daily. 01/07/21   Logan Ubaldo NOVAK, PA-C  Magnesium  200 MG TABS Take 400 mg by mouth in the morning, at noon, and at bedtime.    [provider]  Melatonin 10 MG CAPS Take 10 mg by mouth at bedtime as needed (sleep).    [provider]  methocarbamol  (ROBAXIN ) 500 MG tablet Take 1 tablet (500 mg total) by mouth every 6 (six) hours as needed for muscle spasms. 01/07/21   Logan Ubaldo NOVAK, PA-C  Multiple Vitamin (MULTIVITAMIN WITH MINERALS) TABS tablet Take 2 tablets by mouth in the morning.    [provider]  oxyCODONE -acetaminophen  (PERCOCET) 10-325 MG tablet Take 1 tablet by mouth 4 (four) times daily as needed for pain.    [provider]  polyethylene glycol (MIRALAX  / GLYCOLAX ) 17 g packet Take 17 g by mouth daily as needed for mild constipation. 01/07/21   Logan Ubaldo NOVAK, PA-C  promethazine  (PHENERGAN ) 12.5 MG tablet Take 1 tablet (12.5 mg total) by mouth every 6 (six) hours as needed for nausea or vomiting. 01/07/21   Logan Ubaldo NOVAK, PA-C  DULoxetine  (CYMBALTA ) 60 MG capsule Take 60 mg by mouth at bedtime.   08/06/20  [provider]   No results found.  Positive ROS: All other systems have been reviewed and were otherwise negative with the exception of those mentioned in the HPI and as above.  Physical Exam: General: Alert, no acute distress Cardiovascular: No pedal edema Respiratory: No cyanosis, no use of accessory musculature GI: No organomegaly, abdomen is soft and non-tender Skin: No lesions in the area of chief complaint Neurologic: Sensation intact distally Psychiatric: Patient is competent for consent with normal mood and affect Lymphatic: No axillary or cervical lymphadenopathy  MUSCULOSKELETAL: Left lower extremity is warm and well-perfused.  No open wounds or  lesions.  Assessment: 1.  Left knee acute medial meniscus tear  2.  Left knee stress fracture medial tibial plateau  Plan: Plan to proceed today with arthroscopic surgery for partial meniscectomy and then percutaneous internal fixation of medial tibial plateau.  We again discussed the risk and benefits of the procedure in detail to include are not limited to bleeding, infection, damage to surrounding nerves and vessels, stiffness, persistent pain, progression of arthritis, need for further surgery, development of DVT as well as the risk of anesthesia.  She has provided informed consent.   She will be full weightbearing as tolerated postoperatively and discharged home today from PACU.    [name], MD Cell [phone]    05/08/2024 11:23 AM

## 2024-05-09 NOTE — Anesthesia Postprocedure Evaluation (Signed)
 Anesthesia Post Note  Patient: Megan Robles  Procedure(s) Performed: ARTHROSCOPY, KNEE, WITH MEDIAL MENISCECTOMY (Left: Knee) ARTHROSCOPY, KNEE, WITH SUBCHONDROPLASTY (Left: Knee)     Patient location during evaluation: PACU Anesthesia Type: General Level of consciousness: awake and alert Pain management: pain level controlled Vital Signs Assessment: post-procedure vital signs reviewed and stable Respiratory status: spontaneous breathing, nonlabored ventilation, respiratory function stable and patient connected to nasal cannula oxygen Cardiovascular status: blood pressure returned to baseline and stable Postop Assessment: no apparent nausea or vomiting Anesthetic complications: no   No notable events documented.  Last Vitals:  Vitals:   05/08/24 1515 05/08/24 1530  BP: 107/75 100/64  Pulse: 87 88  Resp: 16 18  Temp:  (!) 36.3 C  SpO2: 99% 95%    Last Pain:  Vitals:   05/08/24 1530  TempSrc:   PainSc: 5                  Ying Blankenhorn S

## 2024-05-11 ENCOUNTER — Encounter (HOSPITAL_BASED_OUTPATIENT_CLINIC_OR_DEPARTMENT_OTHER): Payer: Self-pay | Admitting: Orthopedic Surgery
